# Patient Record
Sex: Male | Born: 1950 | Race: White | Hispanic: No | Marital: Married | Smoking: Never smoker
Health system: Southern US, Community
[De-identification: ages and names within clinical notes are randomized; demographics above are authoritative.]

## PROBLEM LIST (undated history)

## (undated) DIAGNOSIS — N529 Male erectile dysfunction, unspecified: Secondary | ICD-10-CM

## (undated) DIAGNOSIS — I878 Other specified disorders of veins: Secondary | ICD-10-CM

## (undated) DIAGNOSIS — G629 Polyneuropathy, unspecified: Secondary | ICD-10-CM

## (undated) DIAGNOSIS — R809 Proteinuria, unspecified: Secondary | ICD-10-CM

## (undated) DIAGNOSIS — M1990 Unspecified osteoarthritis, unspecified site: Secondary | ICD-10-CM

## (undated) DIAGNOSIS — E669 Obesity, unspecified: Secondary | ICD-10-CM

## (undated) DIAGNOSIS — E785 Hyperlipidemia, unspecified: Secondary | ICD-10-CM

## (undated) DIAGNOSIS — I10 Essential (primary) hypertension: Secondary | ICD-10-CM

## (undated) HISTORY — DX: Polyneuropathy, unspecified: G62.9

## (undated) HISTORY — DX: Hyperlipidemia, unspecified: E78.5

## (undated) HISTORY — PX: BACK SURGERY: SHX140

## (undated) HISTORY — PX: JOINT REPLACEMENT: SHX530

## (undated) HISTORY — DX: Proteinuria, unspecified: R80.9

## (undated) HISTORY — DX: Male erectile dysfunction, unspecified: N52.9

## (undated) HISTORY — PX: HERNIA REPAIR: SHX51

## (undated) HISTORY — DX: Other specified disorders of veins: I87.8

## (undated) HISTORY — DX: Obesity, unspecified: E66.9

## (undated) HISTORY — PX: REPLACEMENT TOTAL KNEE: SUR1224

---

## 2000-05-04 ENCOUNTER — Encounter: Admission: RE | Admit: 2000-05-04 | Discharge: 2000-05-04 | Payer: Self-pay | Admitting: Preventative Medicine

## 2000-05-04 ENCOUNTER — Encounter: Payer: Self-pay | Admitting: Preventative Medicine

## 2000-05-17 ENCOUNTER — Encounter: Payer: Self-pay | Admitting: Preventative Medicine

## 2000-05-17 ENCOUNTER — Encounter: Admission: RE | Admit: 2000-05-17 | Discharge: 2000-05-17 | Payer: Self-pay | Admitting: Preventative Medicine

## 2000-05-31 ENCOUNTER — Encounter: Admission: RE | Admit: 2000-05-31 | Discharge: 2000-05-31 | Payer: Self-pay | Admitting: Preventative Medicine

## 2000-05-31 ENCOUNTER — Encounter: Payer: Self-pay | Admitting: Preventative Medicine

## 2000-07-20 ENCOUNTER — Encounter: Payer: Self-pay | Admitting: Neurosurgery

## 2000-07-21 ENCOUNTER — Encounter: Payer: Self-pay | Admitting: Neurosurgery

## 2000-07-21 ENCOUNTER — Ambulatory Visit (HOSPITAL_COMMUNITY): Admission: RE | Admit: 2000-07-21 | Discharge: 2000-07-21 | Payer: Self-pay | Admitting: Neurosurgery

## 2001-11-01 ENCOUNTER — Ambulatory Visit (HOSPITAL_COMMUNITY): Admission: RE | Admit: 2001-11-01 | Discharge: 2001-11-01 | Payer: Self-pay | Admitting: Internal Medicine

## 2002-02-18 ENCOUNTER — Encounter: Payer: Self-pay | Admitting: Emergency Medicine

## 2002-02-18 ENCOUNTER — Emergency Department (HOSPITAL_COMMUNITY): Admission: EM | Admit: 2002-02-18 | Discharge: 2002-02-18 | Payer: Self-pay | Admitting: Emergency Medicine

## 2002-07-10 ENCOUNTER — Encounter: Payer: Self-pay | Admitting: Family Medicine

## 2002-07-10 ENCOUNTER — Ambulatory Visit (HOSPITAL_COMMUNITY): Admission: RE | Admit: 2002-07-10 | Discharge: 2002-07-10 | Payer: Self-pay | Admitting: Family Medicine

## 2002-07-17 ENCOUNTER — Encounter: Payer: Self-pay | Admitting: Family Medicine

## 2002-07-17 ENCOUNTER — Ambulatory Visit (HOSPITAL_COMMUNITY): Admission: RE | Admit: 2002-07-17 | Discharge: 2002-07-17 | Payer: Self-pay | Admitting: Family Medicine

## 2002-08-07 ENCOUNTER — Encounter: Payer: Self-pay | Admitting: Orthopedic Surgery

## 2002-08-12 ENCOUNTER — Inpatient Hospital Stay (HOSPITAL_COMMUNITY): Admission: RE | Admit: 2002-08-12 | Discharge: 2002-08-15 | Payer: Self-pay | Admitting: Orthopedic Surgery

## 2002-08-12 ENCOUNTER — Encounter (INDEPENDENT_AMBULATORY_CARE_PROVIDER_SITE_OTHER): Payer: Self-pay | Admitting: Specialist

## 2002-08-15 ENCOUNTER — Encounter: Payer: Self-pay | Admitting: Orthopedic Surgery

## 2006-09-08 ENCOUNTER — Ambulatory Visit (HOSPITAL_COMMUNITY): Admission: RE | Admit: 2006-09-08 | Discharge: 2006-09-08 | Payer: Self-pay | Admitting: General Surgery

## 2006-09-08 ENCOUNTER — Encounter (INDEPENDENT_AMBULATORY_CARE_PROVIDER_SITE_OTHER): Payer: Self-pay | Admitting: General Surgery

## 2009-06-22 ENCOUNTER — Ambulatory Visit: Payer: Self-pay | Admitting: Surgery
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
## 2010-06-15 NOTE — Op Note (Signed)
NAMEJORDI, LACKO                ACCOUNT NO.:  [id number]   MEDICAL RECORD NO.:  [id number]          PATIENT TYPE:  AMB   LOCATION:  DAY                          FACILITY:  [facility]   PHYSICIAN:  Timothy E. Earlene Plater, M.D. DATE OF BIRTH:  1951/01/19   DATE OF PROCEDURE:  09/08/2006  DATE OF DISCHARGE:                               OPERATIVE REPORT   PREOPERATIVE DIAGNOSIS:  Primary ventral hernia.   POSTOPERATIVE DIAGNOSIS:  Primary ventral hernia.   PROCEDURE:  Repair of hernia with mesh.   SURGEON:  Kendrick Ranch, MD   ANESTHESIA:  General.   Brandon Villarreal is 65, has an enlarging, oftentimes painful primary ventral  hernia.  He was seen earlier and strongly advised to control diabetes  and lose weight, which he has done and now he is prepared for this  surgery.  He was seen, identified and permit signed.   He was evaluated by Anesthesia.   He was taken to the operating room, placed supine and general  endotracheal anesthesia administered.  The previously located and marked  hernia defect was in the midline halfway between the umbilicus and the  sternum, palpated to be 4-6 cm; it was not reducible.  The area was  clipped, prepped and draped in the usual fashion.  Marcaine 0.5% with  epinephrine was used prior to the incision.  A horizontal incision was  made over the defect and hernia sac immediately identified; it was  separated from the surrounding subcutaneous tissue and the surrounding  fascia also dissected free for easy visualization.  The hernial defect  was actually 4 cm and the sac was opened, the excess sac excised and  multiple adhesions to the bowel and omentum were taken down sharply and  all reduced into the abdomen.  Likewise, under the edges of the fascia,  the adhesions were taken down.  A 6-cm Bard Ventralex hernia patch was  used; it was carefully placed; it was sutured in place with 4 stay  sutures, then brought flush to the surface and all sutures tied.  Four  additional sutures were placed through the anterior fascia; this  completed the repair.  The deep subcu was closed with 3-0 Vicryl over  mesh and the skin and subcu closed with wide skin staples.  All counts  were correct.  He tolerated it well and a dry sterile dressing applied.  He was taken to the recovery room in good condition.   Written and verbal instructions were given to him and his wife including  Percocet for pain and he will be seen and followed in the office.      Timothy E. Earlene Plater, M.D.  Electronically Signed     TED/MEDQ  D:  09/08/2006  T:  09/09/2006  Job:  [id number]   cc:   Donna Bernard, M.D.  Fax: [phone]

## 2010-06-15 NOTE — Procedures (Signed)
DUPLEX DEEP VENOUS EXAM - LOWER EXTREMITY   INDICATION:  Venous stasis ulcer, right leg.   HISTORY:  Edema:  Yes.  Trauma/Surgery:  No.  Pain:  Yes.  PE:  No.  Previous DVT:  No.  Anticoagulants:  No.  Other:   DUPLEX EXAM:                CFV   SFV   PopV  PTV    GSV                R  L  R  L  R  L  R   L  R  L  Thrombosis    0  0  0     0     0      0  Spontaneous   +  +  +     +     +      +  Phasic        +  +  +     +     +      +  Augmentation  +  +  +     +     +      +  Compressible  +  +  +     +     +      +  Competent     D  +  D     D     +      +   Legend:  + - yes  o - no  p - partial  D - decreased   IMPRESSION:  1. Right leg is negative for deep venous thrombosis.  2. Phlebitis is noted in the proximal right calf.  3. The greater saphenous vein and smaller saphenous vein do not      display reflux; however, there is an anterolateral tributary that      does reflux with extensive varicose veins.  4. The common femoral vein, saphenofemoral junction, superficial      femoral veins, and popliteal veins do display reflux.  5. The calf veins are difficult to visualize due to increased edema      and increased BSA.    _____________________________  V. Charlena Cross, MD   NT/MEDQ  D:  06/22/2009  T:  06/22/2009  Job:  [id number]

## 2010-06-18 NOTE — Op Note (Signed)
NAMEDEWARD, SEBEK                            ACCOUNT NO.:  [id number]   MEDICAL RECORD NO.:  [id number]                   PATIENT TYPE:  AMB   LOCATION:  DAY                                  FACILITY:  [facility]   PHYSICIAN:  Gustaf Mccarter. Rourk, M.D.               DATE OF BIRTH:  [birth year]   DATE OF PROCEDURE:  11/01/2001  DATE OF DISCHARGE:                                 OPERATIVE REPORT   PROCEDURE:  Screening colonoscopy.   ENDOSCOPIST:  Gerrit Friends. Rourk, M.D.   INDICATIONS FOR PROCEDURE:  The patient is a 60 year old gentleman referred  for colorectal cancer screening.  He is devoid of any lower GI tract  symptoms.  No family history of colorectal neoplasia.  He has never had his  lower GI tract imaged previously.  Colonoscopy is now being done as a  screening maneuver.  This approach has been discussed with the patient at  length at the bedside.  The potential risks, benefits, and alternatives have  been reviewed; questions answered  He is agreeable.  Please see my  handwritten H&P.  I feel that he is low risk for conscious sedation in the  way of Versed and Demerol.   DESCRIPTION OF PROCEDURE:  O2 saturation, blood pressure, pulse and  respirations were monitored throughout the entire procedure.   CONSCIOUS SEDATION:  Versed 3 mg IV, Demerol 75 mg IV.   INSTRUMENT:  Olympus video chip adult colonoscope.   FINDINGS:  Digital rectal exam revealed no abnormalities.   ENDOSCOPIC FINDINGS:  The prep was good.   RECTUM:  Examination of the rectal mucosa including the retroflex view of  the anal verge revealed no abnormalities.   COLON:  The colonic mucosa was surveyed from the rectosigmoid junction  through the left transverse and right colon to the area of the appendiceal  orifice, ileocecal valve, and cecum.  These structures were well seen and  photographed for the record.  No colonic mucosal abnormalities were noted.  Upon advancing the scope to the cecum and from the  level of the cecum and  ileocecal valve the scope was slowly withdrawn.  All previously mentioned  mucosal surfaces were again seen; and, again, no abnormalities were  observed.  The patient tolerated the procedure well and was reacted in  endoscopy.   IMPRESSION:  1. Normal rectum.  2.     Normal colon.  3. Follow up with Dr. Gerda Diss.   RECOMMENDATIONS:  Repeat colonoscopy in 10 years.                                               Gerrit Friends. Rourk, M.D.    RMR/MEDQ  D:  11/01/2001  T:  11/02/2001  Job:  [id number]   cc:  Dr. Gerda Diss

## 2010-06-18 NOTE — Discharge Summary (Signed)
Brandon Villarreal, NIBERT                          ACCOUNT NO.:  [id number]   MEDICAL RECORD NO.:  [id number]                   PATIENT TYPE:  INP   LOCATION:  5035                                 FACILITY:  [facility]   PHYSICIAN:  John L. Rendall, M.D.               DATE OF BIRTH:  08/24/1950   DATE OF ADMISSION:  08/12/2002  DATE OF DISCHARGE:  08/15/2002                                 DISCHARGE SUMMARY   ADMISSION DIAGNOSES:  1. Tricompartmental osteoarthritis, left knee.  2. Type 2 diabetes mellitus.  3. Hypertension.  4. Obesity.  5. Hypercholesterolemia.  6. Mild depression.  7. Peripheral vascular disease/varicose veins.   DISCHARGE DIAGNOSES:  1. Tricompartmental osteoarthritis, left knee, status post left total knee     arthroplasty.  2. Acute blood loss anemia secondary to surgery.  3. Constipation, now resolved.  4. Type 2 diabetes mellitus.  5. Hypertension.  6. Obesity.  7. Hypercholesterolemia.  8. Mild depression.  9. Peripheral vascular disease/varicose veins.   SURGICAL PROCEDURE:  On August 12, 2002, Mr. Brandon Villarreal underwent a left total knee  arthroplasty by Dr. Erasmo Villarreal assisted by Brandon Villarreal, P.A.-C.  He had a  Depuy RP insert size large, 17.5 mm thickness with an MBT revision cemented  tibial tray, size 5, with a PFC modular knee system, tibial fluted rod, 18  mm x 75 mm.  He then had an LCS knee rotating through thickened cemented  patella, large size, with an LCS complete primary femoral component and  cemented size large, left.   COMPLICATIONS:  None.   CONSULTS:  1. Case management, physical therapy and occupational therapy consult August 13, 2002.  2. Rehab medicine consult August 13, 2002.   HISTORY OF PRESENT ILLNESS:  This 60 year old white male patient presents to  Dr. Priscille Villarreal with a 5-10 year history of gradual onset progressive left knee  pain.  The pain is constant with varying in intensity.  Sharp knife-like  sensation over the lateral  aspect of the joint line without radiation.  It  increases with prolonged weightbearing and decreases with rest.  He has  failed conservative treatment and because of that he is presenting for a  left knee replacement.   HOSPITAL COURSE:  Mr. Brandon Villarreal tolerated his surgical procedure well without  any postoperative complications.  He was transferred to 5000.  On  postoperative day #1 T-max was 99.3, vital signs stable.  Hemoglobin 9.9,  hematocrit 28.  He had a large amount of bloody drainage from the knee wound  and that dressing was reinforced and monitored.   He did well over the next several days.  He was switched to p.o. pain  medicine and his knee incision was well approximated with staples.  On July  14, he was ambulating well but did get a little dizzy or lightheaded.  His  pulse was 100, blood pressure 100/57.  Hemoglobin that morning was 9 with  hematocrit of 25.8 but a repeat hemoglobin at 2 p.m. showed hemoglobin of  8.3 with hematocrit of 23.9.  He was subsequently transfused with two units  of packed red blood cells.  Repeat CBC is pending today, July 15.   At this point T-max was 100.5.  Vitals are stable.  He is ready for  discharge home.  He feels much better after his transfusion with no more  dizziness. He will be discharged home later today after repeat chest x-ray,  apical lordotic views, to evaluate possible nodule and after physical  therapy.   DISCHARGE INSTRUCTIONS:   DIET:  He is to resume his prehospitalization diabetic diet.   DISCHARGE MEDICATIONS:  1. He may resume his prehospitalization medications except no Relafen,     phentermine, aspirin or Vicodin.  2. He may restart the aspirin on July 20, after the Arixtra is completed.  Home medicines include:  1. Doxazosin 8 mg p.o. daily.  2. Lexapro 10 mg p.o. daily.  3. Lopid 600 mcg p.o. daily.  4. Lotrel 5/20 mg p.o. daily.  5. Hydrochlorothiazide 25 mg half tablet p.o. daily.  6. DiaBeta 5 mg p.o.  daily.  7. Megaman vitamins one tablet p.o. daily.  8. Coral calcium one tablet p.o. daily.  9. Chondroitin glucosamine one tablet p.o. daily.   ADDITIONAL MEDICATIONS:  1. Arixtra 2.5 mg subcu daily for seven days with last dose August 19, 2002.  2. OxyContin 10 mg two tablets p.o. q.12h. x7 days and then one tablet p.o.     q.12h. for seven days.  #42, with no refill.  1. Percocet 5/325 mg one to two p.o. q.4h. p.r.n. for pain, #45 with no     refill.   ACTIVITY:  He can be out of bed weightbearing as tolerated on the left leg  with the use of a walker.  He is arranged for home CPM 0-100 degrees a day  and home health PT per [facility].  Please see the blue total  knee discharge instruction sheet for further activity, wound care and  special instructions.  Additions to the blue discharge sheet include that he may not shower until  no drainage from the wound for two days.   FOLLOW UP:  He needs followup appointment with  Dr. Priscille Villarreal in approximately  10-12 days and he needs to call [phone] for that appointment.   LABORATORY DATA:  Chest x-ray taken on August 07, 2002 showed no active chest  disease but a right apical nodular density which was not seen on previous  chest x-ray.  Apical lordotic views are recommended and will be done on July  15 before discharge. On August 12, 2002, hemoglobin 11.5, hematocrit 32.7.  On  July 13, hemoglobin 99, hematocrit 28.  On August 14, 2002, initially  hemoglobin was 9, hematocrit 25.8, but at 2 p.m. hemoglobin was 83,  hematocrit 23.9.  Platelets were 131 and white count 8.7.  On July 7,  glucose 161.  On July 13, glucose was 169, calcium 8, and on July 17, sodium  was 131, glucose 139, calcium 8.3.  Urinalysis on July 7 showed 21-50 red  blood cells.  All other indices within normal limits.  Left knee synovium  biopsy done on August 12, 2002 showed focal villonodular architecture, synovial hyperplasia and pigmented histiocytes.      Legrand Pitts Duffy, P.A.  John L. Brandon Villarreal, M.D.    KED/MEDQ  D:  08/15/2002  T:  08/16/2002  Job:  [id number]   cc:   Donna Bernard, M.D.  [address]  [place]  [place] [address]  Fax: [phone]    cc:   Donna Bernard, M.D.  [address]  [place]  [place] [address]  Fax: [phone]

## 2010-06-18 NOTE — Op Note (Signed)
Brandon Villarreal, Brandon Villarreal                          ACCOUNT NO.:  [id number]   MEDICAL RECORD NO.:  [id number]                   PATIENT TYPE:  INP   LOCATION:  2550                                 FACILITY:  [facility]   PHYSICIAN:  John L. Rendall III, M.D.           DATE OF BIRTH:  May 04, 1950   DATE OF PROCEDURE:  08/12/2002  DATE OF DISCHARGE:                                 OPERATIVE REPORT   PREOPERATIVE DIAGNOSIS:  End stage osteoarthritis, left knee with morbid  obesity.   POSTOPERATIVE DIAGNOSIS:  End stage osteoarthritis, left knee with morbid  obesity.   OPERATION PERFORMED:  Total knee replacement, left using revision tibial  component.   SURGEON:  John L. Rendall, M.D.   ASSISTANT:  Legrand Pitts. Duffy, P.A.   ANESTHESIA:  General.   PATHOLOGY:  The patient weighs 320 pounds.  He has a worn out left knee and  an incredible number of loose bodies in the knee with significant spurring.  With all of the loose bodies, the size of the leg and spurring, it is much  more difficult than usual and due to fear of the tibial component sinking  into relatively soft bone, a revision tibial component was used in this  primary knee.   DESCRIPTION OF PROCEDURE:  Under general anesthesia, the left leg was  prepared with DuraPrep and draped as a sterile field.  A midline incision  was made carried medial parapatellar deep, the patella was everted with some  difficulty as there is a one and a half inch spur on the top of it.  Due to  difficulty was moving this heavily spurred patella, the patella osteotomy  was done under primary exposure.  The medial collateral ligament and medial  tibia were exposed and stripped from the bone half way around to the  posteromedial corner. This allowed exposure medially. A large loose body  infrapatellar then came out.  It measured 1-1/2 inch x 1-1/2 inch x 1 inch.  It was incredible.  There was significant gross red synovitis and a  synovectomy was done.  There was ridging of one half inch medially and  laterally on the femur and because this is so large, to estimate correctly a  size prosthesis to use, the spurs were taken down with a saw prior to sizing  the femur.  The femur was then sized at a large.  Preliminary cut was then  made removing the spines of the tibia.  Once  this was made, a center hole  was made and the tibia was progressively reamed up to 18 mm to take 140 mm  revision tibial stem.  Once the 18 reamer was properly seated and based  appropriately at this level, a guide was pinned to the front of the tibia  and proximal tibial resection was carried out.  Following this, using the  first femoral guide, an intercondylar drill hole was placed.  Using the  second guide, anterior and posterior flare of the femur were resected with a  15 mm flexion gap.  The intramedullary guide was then used and the distal  femoral cut was made and there was a 15 mm extension gap.  Some play was  noted in the 17 mm guide after release of spurs medially, laterally and  around the tibia.  The 17.5 mm flexion-extension block fit satisfactorily in  both directions.  Recessing guide was then used.  It should be noted that  upon first exposing the medial compartment, approximately a dozen loose  bodies about 1 cm x 1.5 cm x 0.5 cm came out of the posterior medial recess.  Before inserting the recessing guide, the lamina spreader was inserted.  Remnants of the cruciates and menisci were resected and spurs were taken  down off the back of the femoral condyles.  The recessing guide was then  used.  Following this, the proximal tibia was exposed.  The #5 tibial  component with the 140 mm revision stem was placed down the tibia.  A 17.5  bearing was inserted, a large femoral component and the large two peg  patella was tried.  With trial components in place and the retinaculum and  capsule closed with two towel clips, excellent stability and alignment was   obtained.  Permanent components were then obtained and all were cemented in  place.  Once the cement had hardened, excess was removed and the tourniquet  was let down at one hour six minutes.  The excess cement was removed.  Multiple small vessels were cauterized.  The knee was then closed in layers  with #1 Tycron, 0 and 2-0 Vicryl and skin clips.  The patient tolerated the  procedure well and returned to recovery in good condition.                                                John L. Dorothyann Gibbs, M.D.    Renato Gails  D:  08/12/2002  T:  08/12/2002  Job:  [id number]

## 2010-06-18 NOTE — Op Note (Signed)
[facility]  Patient:    Brandon Villarreal, Brandon Villarreal                         MRN: [id number] Proc. Date: 07/21/00 Adm. Date:  [id number] Disc. Date: [id number] Attending:  Gerald Dexter                           Operative Report  PREOPERATIVE DIAGNOSIS:  Herniated disk, L4-5 right.  POSTOPERATIVE DIAGNOSIS:  Herniated disk, L4-5 right.  PROCEDURE:  Right L4-5 intralaminar laminotomy for excision of herniated disk with operating microscope.  SURGEON:  Reinaldo Meeker, M.D.  ASSISTANT:  Julio Sicks, M.D.  DESCRIPTION OF PROCEDURE:  After being placed in the prone position, the patients back was prepped and draped in the usual sterile fashion.  A localizing x-ray was taken prior to incision to identify the L4-5 level.  A midline incision made above the spinous processes of L4 and L5.  Using Bovie cutting current, the incision was carried down to the spinous processes. Subperiosteal dissection was then carried out on the right side of the spinous processes and laminae, and the McCullough self-retaining retractor was placed for exposure.  A second x-ray was taken to confirm approach to the L4-5 level, and this was correct.  Using the high-speed drill, the inferior one-half of the L4 lamina and the medial third of the facet joint were removed.  A drill was then used to remove the superior one-half of the L5 lamina.  Residual bone and ligamentum flavum were removed in a piecemeal fashion.  The microscope was draped, brought in the field, and used for the remainder of the case.  Using microdissection technique, the lateral aspect of the thecal sac and L5 nerve root were identified.  Further coagulation was carried out down to the floor of the _____ to identify the L4-5 disk, which was only focally herniated beneath the nerve root.  After coagulating on the annulus, the annulus was incised with a 15 blade.  Using pituitary rongeurs and curettes, a thorough disk  space clean-out was carried out.  At the same time, great care was taken to avoid injury to the neural elements.  This was successfully done.  At this point, inspection was carried out in all directions for any evidence of residual compression, and none could be identified.  Large amounts of irrigation were carried out and any bleeding controlled with bipolar coagulation and Gelfoam.  The wound was then closed using interrupted Vicryl in the muscle, fascia, subcutaneous, and subcuticular tissues, and staples on the skin.  A sterile dressing was then applied.  Patient was extubated, taken to the recovery room in stable condition. DD:  07/21/00 TD:  07/22/00 Job: [id number]

## 2010-06-18 NOTE — H&P (Signed)
Brandon Villarreal, Brandon Villarreal                          ACCOUNT NO.:  [id number]   MEDICAL RECORD NO.:  [id number]                   PATIENT TYPE:  INP   LOCATION:  NA                                   FACILITY:  [facility]   PHYSICIAN:  John L. Rendall, M.D.               DATE OF BIRTH:  1950/09/17   DATE OF ADMISSION:  DATE OF DISCHARGE:                                HISTORY & PHYSICAL   CHIEF COMPLAINT:  Left knee pain for more than 5 to 10 years.   HISTORY OF PRESENT ILLNESS:  This 60 year old white male patient presents to  Dr. Priscille Kluver with a 5 to 10 year history of gradual onset of progressive left  knee pain. He has no known injury of prior surgery to his knee. At this  point, the pain is pretty much constant, but the intensity varies. Most of  the time, it is a sharp, knife-like sensation over the lateral aspect of the  joint line without radiation. The patient increases with any prolonged any  weight bearing and decreases with rest. He has been on Relafen in the past  which provided no real relief and Vicodin which provides a moderate amount  of relief. He complains of the knee catching, grinding, locking, giving way,  and swelling. It does occasionally awaken him during the night. He has not  had any cortisone or Hyalgan injections in the knee. He is currently walking  with a four-pronged cane and has done so for the last two weeks.   ALLERGIES:  No known drug allergies.   CURRENT MEDICATIONS:  1. Doxazosin 8 mg one tablet p.o. q.d.  2. Lexapro 10 mg one tablet p.o. q.d.  3. Lopid 600 mg one tablet p.o. q.d.  4. Lotrel 5/20 mg one tablet p.o. q.d.  5. Relafen 750 mg one tablet p.o. b.i.d., last dose several days ago.  6. Hydrochlorothiazide 25 mg half a tablet p.o. q.d.  7. DiaBeta 5 mg one tablet p.o. q.d.  8. Phentermine 37.5 mg one tablet p.o. q.d., last dose July 31, 2002.  9. Aspirin 325 mg one tablet p.o. q.d., last dose July 31, 2002.  10.      Vicodin 5/500 mg one to two  p.o. q.4h. p.r.n. for pain.  11.      Megaman vitamin one tablet p.o. q.d.  12.      Coral calcium one tablet p.o. q.d.  13.      Chondroitin and glucosamine one tablet p.o. q.d.   PAST MEDICAL HISTORY:  1. Type 2 diabetes mellitus for seven years.  2. Hypertension for seven years.  3. Hypercholesterolemia for seven years.  4. Mild depression now under control.  5. Umbilical hernia.  6. Varicose veins/venous insufficiency/peripheral vascular disease.   He denies any history of thyroid disease, hiatal hernia, peptic ulcer  disease, heart disease, asthma, or any other chronic medical conditions  other than  noted previously.   PAST SURGICAL HISTORY:  Diskectomy of L3-4 in August 2002 by Dr. Gerlene Fee.   He denies any complications from the above mentioned procedure.   SOCIAL HISTORY:  HE denies any history of cigarette smoking, alcohol use, or  drug use. He is married and has one daughter. His wife and daughter live in  a one and a half story house with four steps into the main entrance. He  currently works as a Consulting civil engineer at [employer]. His medical doctor is Dr.  Simone Curia in [place], [place].   FAMILY HISTORY:  His mother is alive at age 101 with hypertension, thyroid  disease, carotid disease, and Alzheimer's. His father died at the age of 1  with a brain tumor. He has three brothers, age 59, 47, and 59 and one sister  age 23, and they are all alive and well. His daughter is 89, and she is  alive and well.   REVIEW OF SYSTEMS:  He does have a remote history of bronchitis. He does  wear glasses. He does not have a living will nor a power of attorney. All  other systems are negative and noncontributory.   PHYSICAL EXAMINATION:  GENERAL:  Well-developed, well-nourished, overweight  white male in no acute distress. Walks with an antalgic gait and a limp.  Mood and affect are appropriate. Talks easily with examiner. Accompanied by  his wife. He does walk with a  four-pronged cane. Height 62 inches, weight  324 pounds, BMI is 41.  VITAL SIGNS:  Temperature 97.8 degrees Fahrenheit, pulse 80, respirations  16, and BP 138/82.  HEENT:  Normocephalic, atraumatic without frontal or maxillary sinus  tenderness to palpation. Conjunctivae are pink. Sclerae are anicteric.  PERRLA. EOMs intact. No visible external ear deformities. Hearing grossly  intact. Tympanic membranes pearly gray bilaterally with good light reflex.  Nose and nasal septum midline. Nasal mucosa pink and moist with some minimal  bloody looking mucus in the nose. Pharynx without erythema or exudates.  Buccal mucosa is pink and moist. Missing several teeth, but dentition  appears to be in generally good repair. Tongue and uvula midline. Tongue  without fasciculations and rises equally with phonation.  NECK:  No visible masses or lesions noted. Trachea midline. No palpable  lymphadenopathy or thyromegaly. Carotids +1 bilaterally without bruits. Full  range of motion and nontender to palpation along the cervical spine.  CARDIOVASCULAR:  Heart rate and rhythm regular. S1 and S2 present without  rubs, clicks, or murmurs noted.  RESPIRATORY:  Respirations even and unlabored. Breath sounds clear to  auscultation bilaterally without rales or wheezes noted.  ABDOMEN:  Rounded abdominal contour. Bowel sounds present x4 quadrants.  Soft, nontender to palpation without hepatosplenomegaly or CVA tenderness.  Femoral pulses are +2 bilaterally. Nontender to palpation along the entire  length of vertebral column.  BREASTS/GENITOURINARY/RECTAL:  These exams deferred at this time.  MUSCULOSKELETAL:  No obvious deformities bilateral upper extremities with  full range of motion. Moves extremities without pain. Radial pulses +2  bilaterally. He has full range of motion of his hips, ankles, and toes  bilaterally. DP pulses are +2 bilaterally. He does have brownish discoloration of his lower leg on the right  and +2-3 pitting edema of both  lower extremities. Visible varicose veins on both lower legs. Right knee has  no obvious deformity. Full extension and flexion to 120 degrees with minimal  crepitus. No pain with palpation along the joint line. Stable to varus and  valgus stress and negative anterior drawer and no effusion. Left knee has  full extension but flexed to 80 degrees only. There is a +2-3 effusion in  the knee. A large amount of crepitus with range of motion. Acutely tender to  palpation on both the medial and lateral joint line. Stable to varus and  valgus stress and negative anterior drawer.  NEUROLOGICAL:  Alert and oriented x3. Cranial nerves II-XII are grossly  intact. Strength 5/5 bilateral upper and lower extremities. Rapid  alternating movements intact. Deep tendon reflexes 1+ bilateral upper and  lower extremities. Sensation intact to light touch.   RADIOLOGIC FINDINGS:  MRA taken of his left leg on June 16 showed bone  bruise and marrow contusion and depressed fracture of the lateral tibial  plateau with advanced degenerative osteoarthritic changes in all three  compartments. There is a knee joint effusion and severe diffuse  chondromalacia. He does have abnormal menisci and multiple loose bodies. He  does have prepatellar and pretibial edema and lateral subluxation of the  patella.   IMPRESSION:  1. Tricompartmental arthritis, left knee.  2. Type 2 diabetes mellitus.  3. Hypertension.  4. Obesity.  5. Hypercholesterolemia.  6. Mild depression.  7. Peripheral vascular disease/varicose veins.   PLAN:  Ms. Maybury will be admitted to [facility] on August 12, 2002  where he will undergo a left total knee arthroplasty by Dr. Jonny Ruiz L. Rendall.  He will undergo all of the routine preoperative laboratory tests  and  studies prior to this procedure.     Legrand Pitts Duffy, P.A.                      John L. Priscille Kluver, M.D.    KED/MEDQ  D:  07/31/2002  T:  07/31/2002   Job:  [id number]

## 2010-11-15 LAB — BASIC METABOLIC PANEL
BUN: 15
Chloride: 101
GFR calc Af Amer: >60
Potassium: 4.4
Sodium: 137

## 2010-11-15 LAB — URINALYSIS, ROUTINE W REFLEX MICROSCOPIC
Bilirubin Urine: NEGATIVE
Hgb urine dipstick: NEGATIVE
Ketones, ur: NEGATIVE
Nitrite: NEGATIVE
Specific Gravity, Urine: 1.018
pH: 6

## 2010-11-15 LAB — CBC
HCT: 41.1
Hemoglobin: 14.5
MCV: 84.5
RBC: 4.86
WBC: 7.8

## 2010-11-15 LAB — DIFFERENTIAL
Eosinophils Absolute: 0.2
Eosinophils Relative: 3
Lymphs Abs: 2.2
Monocytes Absolute: 0.5
Monocytes Relative: 7

## 2011-01-21 ENCOUNTER — Ambulatory Visit: Payer: Self-pay

## 2011-02-02 ENCOUNTER — Encounter (HOSPITAL_COMMUNITY): Payer: Self-pay

## 2011-02-03 NOTE — Patient Instructions (Signed)
20 Brandon Villarreal  02/03/2011   Your procedure is scheduled on:  02/10/11  Report to [facility] at 08:30 AM.  Call this number if you have problems the morning of surgery: [phone]   Remember:   Do not eat food:After Midnight.  May have clear liquids:until Midnight .  Clear liquids include soda, tea, black coffee, apple or grape juice, broth.  Take these medicines the morning of surgery with A SIP OF WATER: Amlodipine and Benazepril.   Do not wear jewelry, make-up or nail polish.  Do not wear lotions, powders, or perfumes. You may wear deodorant.  Do not shave 48 hours prior to surgery.  Do not bring valuables to the hospital.  Contacts, dentures or bridgework may not be worn into surgery.  Leave suitcase in the car. After surgery it may be brought to your room.  For patients admitted to the hospital, checkout time is 11:00 AM the day of discharge.   Patients discharged the day of surgery will not be allowed to drive home.  Name and phone number of your driver:   Special Instructions: CHG Shower Use Special Wash: 1/2 bottle night before surgery and 1/2 bottle morning of surgery.   Please read over the following fact sheets that you were given: Pain Booklet, MRSA Information, Surgical Site Infection Prevention, Anesthesia Post-op Instructions and Care and Recovery After Surgery    Hammer Toes Hammer toes occur when the joint in one or more of your toes is permanently flexed. CAUSES  This happens when a muscle imbalance or abnormal bone length makes the small toes buckle under. This causes the toe joint to contract. This causes the tendons (cord like structure) to shorten.  SYMPTOMS   When hammer toes are flexible, you can straighten the buckled joint with your hand. Flexible hammer toes may develop into rigid hammer toes over time. Common symptoms of flexible hammer toes include:   Corns (build-ups of skin cells). Corns occur where boney bumps come in frequent contact with hard  surfaces. For example, where your shoes press and rub.   Irritation.   Inflammation.   Pain.   Toe motion is limited.   When a rigid hammer toe is fixed you can no longer straighten the buckled joint. Corns, irritation, pain, and loss of motion is generally worse for rigid hammer toes than for flexible ones.  TREATMENT  The problems noted above if painful or troublesome can be corrected with surgery. This is an elective surgery, so you can pick a convenient time for the procedure. The surgery may:  Improve appearance.   Relieve pain.   Improve function.  You may be asked not to put weight on this foot for a few weeks. There are several types of surgical treatments. Common treatments are listed below. Your surgeon will discuss what is be best for you.   With arthroplasty, a portion of the joint is surgically removed and the toe is straightened. The "gap" fills in with fibroustissue. This helps with pain, deformity and function.   With fusion, cartilage between the two bones is taken out and the bones heal as one longer bone. This helps keep the toe stable and reduces pain but leaves the toe stiff, yet straight.   With implant, a portion of the bone is removed and replaced with an implant to restore motion.   Flexor tendon transfers may be used to release the deforming force which buckles the toe. This is done by the repositioning of the tendons that  curl the toes down (flexor tendons).  Several of these options require fixing the toe with a pin that is visible at the tip of the toe. The pin keeps the toe straight during healing. It is generally removed in the office at 4-8 weeks after the corrective procedure. Generally, removing the pin is not painful.  LET YOUR CAREGIVER KNOW ABOUT:  Allergies.   Medications taken including herbs, eye drops, over the counter medications, and creams.   Use of steroids (by mouth or creams).   Previous problems with anesthetics or novocaine.    Possibility of pregnancy, if this applies.   History of blood clots (thrombophlebitis).   History of bleeding or blood problems.   Previous surgery.   Other health problems.   Family history of anesthetic problems.  BEFORE THE PROCEDURE You should be present 60 minutes prior to your procedure unless otherwise directed by your caregiver.  RISKS AND COMPLICATIONS  If surgery is recommended, your caregiver will explain your foot problem and how surgery can improve it. Your caregiver can answer questions you may have about potential risks and complications involved.  Let your caregiver know about health changes prior to surgery. It is best to do elective surgeries when you are healthy. Be sure to ask your caregiver how long you will be off your feet and if you need to be off work. Plan accordingly. Your foot and ankle may be immobilized by a cast (from your toes to below your knee). You may be asked not to bear weight on this foot for a few weeks. AFTER THE PROCEDURE You can bear weight as instructed. You may need a bandage, splint, and removable cast boot or surgical shoe for several weeks after surgery. You may resume normal diet and activities as directed. Only take over-the-counter or prescription medicines for pain, discomfort, or fever as directed by your caregiver. SEEK MEDICAL CARE IF:   You have increased bleeding (more than a small spot) from the wound.   You notice redness, swelling, or increasing pain in the wound.   You notice pus coming from the wound or the pin that is used to stabilize the toe.   You notice a bad smell coming from the wound or dressing.  SEEK IMMEDIATE MEDICAL CARE IF:   You have a fever.   You develop a rash.   You have difficulty breathing.   You have any allergic problems.  Document Released: 01/15/2000 Document Revised: 09/29/2010 Document Reviewed: 02/15/2008 [facility] Patient Information 2012 [place], [place].   PATIENT  INSTRUCTIONS POST-ANESTHESIA  IMMEDIATELY FOLLOWING SURGERY:  Do not drive or operate machinery for the first twenty four hours after surgery.  Do not make any important decisions for twenty four hours after surgery or while taking narcotic pain medications or sedatives.  If you develop intractable nausea and vomiting or a severe headache please notify your doctor immediately.  FOLLOW-UP:  Please make an appointment with your surgeon as instructed. You do not need to follow up with anesthesia unless specifically instructed to do so.  WOUND CARE INSTRUCTIONS (if applicable):  Keep a dry clean dressing on the anesthesia/puncture wound site if there is drainage.  Once the wound has quit draining you may leave it open to air.  Generally you should leave the bandage intact for twenty four hours unless there is drainage.  If the epidural site drains for more than 36-48 hours please call the anesthesia department.  QUESTIONS?:  Please feel free to call your physician or the hospital  operator if you have any questions, and they will be happy to assist you.     [facility] Anesthesia Department [address] [place] [phone]

## 2011-02-04 ENCOUNTER — Encounter (HOSPITAL_COMMUNITY): Payer: Self-pay

## 2011-02-04 ENCOUNTER — Encounter (HOSPITAL_COMMUNITY)
Admission: RE | Admit: 2011-02-04 | Discharge: 2011-02-04 | Disposition: A | Discharge status: Home / Self Care | Payer: 59 | Source: Ambulatory Visit | Attending: Podiatry | Admitting: Podiatry

## 2011-02-04 ENCOUNTER — Other Ambulatory Visit: Payer: Self-pay

## 2011-02-04 HISTORY — DX: Essential (primary) hypertension: I10

## 2011-02-04 LAB — BASIC METABOLIC PANEL
BUN: 16 mg/dL (ref 6–23)
CO2: 26 mEq/L (ref 19–32)
Chloride: 102 mEq/L (ref 96–112)
Creatinine, Ser: 0.59 mg/dL (ref 0.50–1.35)

## 2011-02-04 LAB — SURGICAL PCR SCREEN: MRSA, PCR: NEGATIVE

## 2011-02-10 ENCOUNTER — Ambulatory Visit (HOSPITAL_COMMUNITY)
Admission: RE | Admit: 2011-02-10 | Discharge: 2011-02-10 | Disposition: A | Discharge status: Home / Self Care | Payer: 59 | Source: Ambulatory Visit | Attending: Podiatry | Admitting: Podiatry

## 2011-02-10 ENCOUNTER — Encounter (HOSPITAL_COMMUNITY): Payer: Self-pay | Admitting: *Deleted

## 2011-02-10 ENCOUNTER — Encounter (HOSPITAL_COMMUNITY): Payer: Self-pay | Admitting: Anesthesiology

## 2011-02-10 ENCOUNTER — Ambulatory Visit (HOSPITAL_COMMUNITY): Payer: 59 | Admitting: Anesthesiology

## 2011-02-10 ENCOUNTER — Encounter (HOSPITAL_COMMUNITY)
Admission: RE | Disposition: A | Discharge status: Home / Self Care | Payer: Self-pay | Source: Ambulatory Visit | Attending: Podiatry

## 2011-02-10 DIAGNOSIS — Z794 Long term (current) use of insulin: Secondary | ICD-10-CM | POA: Insufficient documentation

## 2011-02-10 DIAGNOSIS — Z79899 Other long term (current) drug therapy: Secondary | ICD-10-CM | POA: Insufficient documentation

## 2011-02-10 DIAGNOSIS — Z01812 Encounter for preprocedural laboratory examination: Secondary | ICD-10-CM | POA: Insufficient documentation

## 2011-02-10 DIAGNOSIS — M2040 Other hammer toe(s) (acquired), unspecified foot: Secondary | ICD-10-CM

## 2011-02-10 DIAGNOSIS — Z6841 Body Mass Index (BMI) 40.0 and over, adult: Secondary | ICD-10-CM | POA: Insufficient documentation

## 2011-02-10 DIAGNOSIS — Z01810 Encounter for preprocedural cardiovascular examination: Secondary | ICD-10-CM | POA: Insufficient documentation

## 2011-02-10 DIAGNOSIS — E119 Type 2 diabetes mellitus without complications: Secondary | ICD-10-CM | POA: Insufficient documentation

## 2011-02-10 DIAGNOSIS — I10 Essential (primary) hypertension: Secondary | ICD-10-CM | POA: Insufficient documentation

## 2011-02-10 DIAGNOSIS — L97509 Non-pressure chronic ulcer of other part of unspecified foot with unspecified severity: Secondary | ICD-10-CM | POA: Insufficient documentation

## 2011-02-10 HISTORY — PX: HAMMER TOE SURGERY: SHX385

## 2011-02-10 LAB — GLUCOSE, CAPILLARY: Glucose-Capillary: 219 mg/dL — ABNORMAL HIGH (ref 70–99)

## 2011-02-10 SURGERY — CORRECTION, HAMMER TOE
Anesthesia: Monitor Anesthesia Care | Site: Foot | Laterality: Right | Wound class: Clean

## 2011-02-10 MED ORDER — ONDANSETRON HCL 4 MG/2ML IJ SOLN: 4.0000 mg | Freq: Once | INTRAMUSCULAR | Status: DC | PRN

## 2011-02-10 MED ORDER — FENTANYL CITRATE 0.05 MG/ML IJ SOLN
INTRAMUSCULAR | Status: AC
  Filled 2011-02-10: qty 2

## 2011-02-10 MED ORDER — FENTANYL CITRATE 0.05 MG/ML IJ SOLN
INTRAMUSCULAR | Status: DC | PRN
  Administered 2011-02-10 (×2): 25 ug via INTRAVENOUS
  Administered 2011-02-10: 50 ug via INTRAVENOUS

## 2011-02-10 MED ORDER — LACTATED RINGERS IV SOLN
INTRAVENOUS | Status: DC | PRN
  Administered 2011-02-10: 10:00:00 via INTRAVENOUS

## 2011-02-10 MED ORDER — CEFAZOLIN SODIUM 1-5 GM-% IV SOLN
INTRAVENOUS | Status: DC | PRN
  Administered 2011-02-10: 2 g via INTRAVENOUS

## 2011-02-10 MED ORDER — BUPIVACAINE HCL (PF) 0.5 % IJ SOLN
INTRAMUSCULAR | Status: AC
  Filled 2011-02-10: qty 30

## 2011-02-10 MED ORDER — MIDAZOLAM HCL 2 MG/2ML IJ SOLN
INTRAMUSCULAR | Status: AC
  Filled 2011-02-10: qty 2

## 2011-02-10 MED ORDER — CEFAZOLIN SODIUM-DEXTROSE 2-3 GM-% IV SOLR: 2.0000 g | INTRAVENOUS | Status: DC

## 2011-02-10 MED ORDER — FENTANYL CITRATE 0.05 MG/ML IJ SOLN: 25.0000 ug | INTRAMUSCULAR | Status: DC | PRN

## 2011-02-10 MED ORDER — LIDOCAINE HCL (PF) 1 % IJ SOLN
INTRAMUSCULAR | Status: AC
  Filled 2011-02-10: qty 5

## 2011-02-10 MED ORDER — LACTATED RINGERS IV SOLN: INTRAVENOUS | Status: DC

## 2011-02-10 MED ORDER — MIDAZOLAM HCL 2 MG/2ML IJ SOLN
1.0000 mg | INTRAMUSCULAR | Status: DC | PRN
  Administered 2011-02-10: 2 mg via INTRAVENOUS

## 2011-02-10 MED ORDER — MIDAZOLAM HCL 5 MG/5ML IJ SOLN
INTRAMUSCULAR | Status: DC | PRN
  Administered 2011-02-10: 2 mg via INTRAVENOUS

## 2011-02-10 MED ORDER — PROPOFOL 10 MG/ML IV EMUL
INTRAVENOUS | Status: DC | PRN
  Administered 2011-02-10: 100 ug/kg/min via INTRAVENOUS

## 2011-02-10 MED ORDER — GLYCOPYRROLATE 0.2 MG/ML IJ SOLN
INTRAMUSCULAR | Status: AC
  Filled 2011-02-10: qty 1

## 2011-02-10 MED ORDER — GLYCOPYRROLATE 0.2 MG/ML IJ SOLN
0.2000 mg | Freq: Once | INTRAMUSCULAR | Status: AC
  Administered 2011-02-10: 0.2 mg via INTRAVENOUS

## 2011-02-10 MED ORDER — HYDROCODONE-ACETAMINOPHEN 7.5-500 MG PO TABS: 1.0000 | ORAL_TABLET | Freq: Four times a day (QID) | ORAL | Status: AC | PRN

## 2011-02-10 MED ORDER — PROPOFOL 10 MG/ML IV EMUL
INTRAVENOUS | Status: AC
  Filled 2011-02-10: qty 20

## 2011-02-10 MED ORDER — BUPIVACAINE HCL 0.5 % IJ SOLN
INTRAMUSCULAR | Status: DC | PRN
  Administered 2011-02-10: 10 mL

## 2011-02-10 MED ORDER — CEFAZOLIN SODIUM-DEXTROSE 2-3 GM-% IV SOLR
INTRAVENOUS | Status: AC
  Filled 2011-02-10: qty 50

## 2011-02-10 MED ORDER — LIDOCAINE HCL (PF) 2 % IJ SOLN
INTRAMUSCULAR | Status: AC
  Filled 2011-02-10: qty 10

## 2011-02-10 SURGICAL SUPPLY — 47 items
BAG HAMPER (MISCELLANEOUS) ×2 IMPLANT
BANDAGE ELASTIC 6 VELCRO ST LF (GAUZE/BANDAGES/DRESSINGS) ×2 IMPLANT
BANDAGE ESMARK 4X12 BL STRL LF (DISPOSABLE) ×1 IMPLANT
BANDAGE GAUZE ELAST BULKY 4 IN (GAUZE/BANDAGES/DRESSINGS) ×2 IMPLANT
BLADE OSC/SAG 18.5X9 THN (BLADE) ×2 IMPLANT
BLADE OSC/SAGITTAL MD 5.5X18 (BLADE) ×2 IMPLANT
BLADE SURG 15 STRL LF DISP TIS (BLADE) ×1 IMPLANT
BLADE SURG 15 STRL SS (BLADE) ×1
BNDG ESMARK 4X12 BLUE STRL LF (DISPOSABLE) ×2
CHLORAPREP W/TINT 26ML (MISCELLANEOUS) ×2 IMPLANT
CLOTH BEACON ORANGE TIMEOUT ST (SAFETY) ×2 IMPLANT
COVER LIGHT HANDLE STERIS (MISCELLANEOUS) ×4 IMPLANT
CUFF TOURNIQUET SINGLE 18IN (TOURNIQUET CUFF) ×2 IMPLANT
DECANTER SPIKE VIAL GLASS SM (MISCELLANEOUS) ×2 IMPLANT
DRAPE OEC MINIVIEW 54X84 (DRAPES) ×2 IMPLANT
DRSG ADAPTIC 3X8 NADH LF (GAUZE/BANDAGES/DRESSINGS) ×2 IMPLANT
DRSG XEROFORM 1X8 (GAUZE/BANDAGES/DRESSINGS) ×2 IMPLANT
DURA STEPPER LG (CAST SUPPLIES) IMPLANT
DURA STEPPER MED (CAST SUPPLIES) IMPLANT
DURA STEPPER SML (CAST SUPPLIES) IMPLANT
DURA STEPPER XL (SOFTGOODS) IMPLANT
ELECT REM PT RETURN 9FT ADLT (ELECTROSURGICAL) ×2
ELECTRODE REM PT RTRN 9FT ADLT (ELECTROSURGICAL) ×1 IMPLANT
GAUZE KERLIX 2  STERILE LF (GAUZE/BANDAGES/DRESSINGS) ×2 IMPLANT
GLOVE BIO SURGEON STRL SZ7.5 (GLOVE) ×2 IMPLANT
GLOVE ECLIPSE 6.5 STRL STRAW (GLOVE) ×2 IMPLANT
GLOVE INDICATOR 7.0 STRL GRN (GLOVE) ×2 IMPLANT
GOWN STRL REIN XL XLG (GOWN DISPOSABLE) ×4 IMPLANT
K-WIRE 6 (WIRE)
KIT ROOM TURNOVER APOR (KITS) ×2 IMPLANT
KWIRE 6 (WIRE) IMPLANT
MANIFOLD NEPTUNE II (INSTRUMENTS) ×2 IMPLANT
NEEDLE HYPO 27GX1-1/4 (NEEDLE) ×6 IMPLANT
NS IRRIG 1000ML POUR BTL (IV SOLUTION) ×2 IMPLANT
PACK BASIC LIMB (CUSTOM PROCEDURE TRAY) ×2 IMPLANT
PAD ARMBOARD 7.5X6 YLW CONV (MISCELLANEOUS) ×2 IMPLANT
PIN CAPS ORTHO GREEN .062 (PIN) IMPLANT
RASP SM TEAR CROSS CUT (RASP) ×2 IMPLANT
SET BASIN LINEN APH (SET/KITS/TRAYS/PACK) ×2 IMPLANT
SPONGE GAUZE 4X4 12PLY (GAUZE/BANDAGES/DRESSINGS) ×2 IMPLANT
STRIP CLOSURE SKIN 1/8X3 (GAUZE/BANDAGES/DRESSINGS) IMPLANT
SUT ETHILON 3 0 FSL (SUTURE) ×2 IMPLANT
SUT MON AB 5-0 PS2 18 (SUTURE) ×2 IMPLANT
SUT VIC AB 4-0 PS2 27 (SUTURE) ×2 IMPLANT
SUT VICRYL AB 3-0 FS1 BRD 27IN (SUTURE) ×2 IMPLANT
SYR CONTROL 10ML LL (SYRINGE) ×4 IMPLANT
TOWEL OR 17X26 4PK STRL BLUE (TOWEL DISPOSABLE) IMPLANT

## 2011-02-10 NOTE — Brief Op Note (Signed)
02/10/2011  12:03 PM  PATIENT:  Brandon Villarreal  61 y.o. 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

## 2011-02-10 NOTE — Anesthesia Postprocedure Evaluation (Signed)
  Anesthesia Post-op Note  Patient: Brandon Villarreal  Procedure(s) Performed:  HAMMER TOE CORRECTION - Percutaneous Flexor Tenotomy third Toe Right Foot  Patient Location: PACU  Anesthesia Type: MAC  Level of Consciousness: awake, alert , oriented and patient cooperative  Airway and Oxygen Therapy: Patient Spontanous Breathing and Patient connected to face mask oxygen  Post-op Pain: none  Post-op Assessment: Post-op Vital signs reviewed, Patient's Cardiovascular Status Stable, Respiratory Function Stable, Patent Airway, No signs of Nausea or vomiting and Pain level controlled  Post-op Vital Signs: Reviewed and stable  Complications: No apparent anesthesia complications

## 2011-02-10 NOTE — H&P (Addendum)
HISTORY AND PHYSICAL INTERVAL NOTE:  02/10/2011  10:07 AM  Brandon Villarreal  has presented today for surgery, with the diagnosis of hammer toe 3rd toe right foot, non-healing wound.  The various methods of treatment have been discussed with the patient.  No guarantees were given.  After consideration of risks, benefits and other options for treatment, the patient has consented to surgery.  I have reviewed the patients' chart and labs.    Patient Vitals for the past 24 hrs:  BP Temp Temp src Pulse Resp SpO2  02/10/11 0921 139/84 mmHg 98.7 F (37.1 C) Oral 83  18  94 %   A history and physical examination was performed in my office on 01/19/2011.  I have reexamined him today.  There have been no changes to this history and physical examination.  Brandon Villarreal, DPM

## 2011-02-10 NOTE — H&P (Signed)
Chief Complaint / History of Present Illness: This 61 year old male returns today for a consultation to discusses the proposed surgery.  He is scheduled for flexor tenotomy of the 3rd digits of the right foot on 02/10/2011 at [facility] under local anesthesia.  Past Medical History: Endocrine Hx: (+) diabetes Type 2.  Cardiovascular Hx: (+) high cholesterol, hypertension.   Medication History: Active: Aspirin 81 mg (active), fenofibrate 160 mg tablet (active), doxazasin mesylate tablets 4 mg tablet (active), furosemide 40 mg tablet (active), klor con m20 tablets (active), amlodipine 10 mg tablet (active), benzepril hcl tablets 40 mg (active), Silvadene 1% cream (Apply cream to surgical site / wound at dressing changes.) (active); usage started on 11/24/2010 medication was prescribed by Theola Sequin DPM, lantus insulin (active), magnesium 500 mg (active), glyburide / metformin 5/500 mg (active), pravastatin 20 mg tablet (active).   Allergies: Patient/Guardian admits allergies to no known drug allergies.   Past Surgical History: Patient/Guardian admits past surgical history of back surgery, knee replacement (left).   Social History: Patient/Guardian denies illegal drug use, Patient/Guardian denies alcohol use, Patient/Guardian denies tobacco use.   Family History: Patient denies a family history of any health problems.   Review of Systems: No abnormal findings with the exception of the chief complaint.    Physical Exam: The patient is a pleasant, 61 year old male in no apparent distress.  He is oriented to person, place and time.  He is ambulatory.  Skin is warm, dry and supple.  Right great toenail and right 2nd toenail is absent.  Pedal hair growth and distribution is diminished.  A hyperkeratotic lesion is present along the distal aspect of right 3rd toe.  Upon debridement of fibrotic tissue, the ulceration measured 0.2 in lenght by 0.2 cm in width by 0.1 cm in depth.  The remaining wound bed  is granular with no acute signs of infection.  Dorsalis pedis and posterior tibial pulses are palpable but diminished.  Capillary refill time is immediate.  No pedal edema is present.  There is dorsal contraction of the 2nd digit and the 3rd digit of the right foot.  Protective sensation is decreased as evaluated with a 5.07 monofilament.    Test Results:  None to report at this time.    Impression:  Diabetes mellitus with peripheral neuropathy.  Hammertoe deformity.  Ulceration.  Plan:   The podiatric pathology and treatment options were again reviewed with the him.  I discussed with the him the surgical procedure itself, the indications, the risks, possible complications, post-operative course and alternative treatments. I gave no guarantees regarding the outcome. He would like to proceed with the proposed surgery.  An informed consent was obtained.  He is to return for his post-operative appointment or sooner if problems arise.

## 2011-02-10 NOTE — Anesthesia Preprocedure Evaluation (Addendum)
Anesthesia Evaluation  Patient identified by MRN, date of birth, ID band Patient awake    Reviewed: Allergy & Precautions, H&P , NPO status , Patient's Chart, lab work & pertinent test results  History of Anesthesia Complications Negative for: history of anesthetic complications  Airway Mallampati: I      Dental  (+) Teeth Intact   Pulmonary  Sinus coingestion clear to auscultation        Cardiovascular hypertension, Pt. on medications Regular Normal    Neuro/Psych    GI/Hepatic   Endo/Other  Diabetes mellitus-, Type 2, Insulin DependentMorbid obesity  Renal/GU      Musculoskeletal   Abdominal (+) obese,   Peds  Hematology   Anesthesia Other Findings   Reproductive/Obstetrics                           Anesthesia Physical Anesthesia Plan  ASA: III  Anesthesia Plan: MAC   Post-op Pain Management:    Induction: Intravenous  Airway Management Planned: Nasal Cannula  Additional Equipment:   Intra-op Plan:   Post-operative Plan:   Informed Consent: I have reviewed the patients History and Physical, chart, labs and discussed the procedure including the risks, benefits and alternatives for the proposed anesthesia with the patient or authorized representative who has indicated his/her understanding and acceptance.     Plan Discussed with:   Anesthesia Plan Comments:         Anesthesia Quick Evaluation

## 2011-02-10 NOTE — Preoperative (Signed)
Beta Blockers   Reason not to administer Beta Blockers:Not Applicable 

## 2011-02-10 NOTE — Transfer of Care (Signed)
Immediate Anesthesia Transfer of Care Note  Patient: Brandon Villarreal  Procedure(s) Performed:  HAMMER TOE CORRECTION - Percutaneous Flexor Tenotomy third Toe Right Foot  Patient Location: PACU  Anesthesia Type: MAC  Level of Consciousness: awake, alert , oriented and patient cooperative  Airway & Oxygen Therapy: Patient Spontanous Breathing and Patient connected to face mask oxygen  Post-op Assessment: Report given to PACU RN and Post -op Vital signs reviewed and stable  Post vital signs: Reviewed and stable Filed Vitals:   02/10/11 1010  BP: 130/81  Pulse:   Temp:   Resp: 18    Complications: No apparent anesthesia complications

## 2011-02-11 NOTE — Op Note (Signed)
Brandon Villarreal, Brandon Villarreal                ACCOUNT NO.:  [id number]  MEDICAL RECORD NO.:  [id number]  LOCATION:  [facility]                          FACILITY:  [facility]  PHYSICIAN:  B. Theola Sequin, MD   DATE OF BIRTH:  December 11, 1950  DATE OF PROCEDURE:  02/10/2011 DATE OF DISCHARGE:  02/10/2011                              OPERATIVE REPORT   SURGEON:  B. Theola Sequin, MD  ASSISTANT:  None.  PREOPERATIVE DIAGNOSIS:  Hammertoe deformity, third digit, right foot, ICD 9735.4, and #2 nonhealing ulceration, right third toe, ICD 1610.96.  POSTOPERATIVE DIAGNOSIS:  Hammertoe deformity, third digit, right foot, ICD 9735.4, and #2 nonhealing ulceration, right third toe, ICD 0454.09.  PROCEDURE:  Percutaneous flexor tenotomy of the third toe, right foot, CPT code, March 10, 2008.  ANESTHESIA:  MAC with local hemostasis.  Pneumatic ankle tourniquet at 250 mmHg.  ESTIMATED BLOOD LOSS:  Minimal (less than 5 mL).  INJECTABLE:  0.5% Marcaine plain.  PATHOLOGY:  None.  COMPLICATIONS:  None.  PROCEDURE IN DETAIL:  The patient was brought to the operating room, altar made on the stretcher in the supine position.  A pneumatic ankle tourniquet was placed about the patient's right ankle.  The foot was anesthetized using 0.5% Marcaine plain.  The foot was scrubbed, prepped, and draped in usual sterile manner.  The limb was then elevated, exsanguinated, and pneumatic ankle tourniquet inflated to 250 mmHg. Attention was then directed to the distal aspect of the third toe of the right foot where a hemorrhagic hyperkeratotic lesion was noted.  This hyperkeratotic tissue was debrided.  Upon debridement, a partial- thickness ulceration was noted along the distal aspect of the right third toe.  No acute sign of infection was present.  Attention was then directed to the plantar aspect of the right third toe, where a percutaneous flexor tenotomy was performed.  The contracture on the toe was noted to be reduced.   Following this procedure, the wound was irrigated with copious amounts of sterile saline and the skin reapproximated using 3-0 nylon in a simple suture technique.  A sterile compressive dressing was then applied to the right foot.  The pneumatic ankle tourniquet deflated and a prompt hyperemic response was noted to all digits of the operative foot.          ______________________________ B. Theola Sequin, MD     BIM/MEDQ  D:  02/10/2011  T:  02/11/2011  Job:  [id number]

## 2011-02-14 ENCOUNTER — Encounter (HOSPITAL_COMMUNITY): Payer: Self-pay | Admitting: Podiatry

## 2011-03-23 ENCOUNTER — Other Ambulatory Visit (HOSPITAL_COMMUNITY): Payer: Self-pay | Admitting: Podiatry

## 2011-03-23 DIAGNOSIS — R609 Edema, unspecified: Secondary | ICD-10-CM

## 2011-03-24 ENCOUNTER — Other Ambulatory Visit (HOSPITAL_COMMUNITY): Payer: Self-pay | Admitting: Podiatry

## 2011-03-24 ENCOUNTER — Ambulatory Visit (HOSPITAL_COMMUNITY)
Admission: RE | Admit: 2011-03-24 | Discharge: 2011-03-24 | Disposition: A | Discharge status: Home / Self Care | Payer: 59 | Source: Ambulatory Visit | Attending: Podiatry | Admitting: Podiatry

## 2011-03-24 DIAGNOSIS — M79609 Pain in unspecified limb: Secondary | ICD-10-CM | POA: Insufficient documentation

## 2011-03-24 DIAGNOSIS — R609 Edema, unspecified: Secondary | ICD-10-CM

## 2011-03-24 DIAGNOSIS — M7989 Other specified soft tissue disorders: Secondary | ICD-10-CM | POA: Insufficient documentation

## 2012-05-01 ENCOUNTER — Other Ambulatory Visit: Payer: Self-pay | Admitting: Family Medicine

## 2012-05-01 ENCOUNTER — Other Ambulatory Visit: Payer: Self-pay | Admitting: *Deleted

## 2012-05-06 ENCOUNTER — Other Ambulatory Visit: Payer: Self-pay | Admitting: Family Medicine

## 2012-05-12 ENCOUNTER — Other Ambulatory Visit: Payer: Self-pay | Admitting: Family Medicine

## 2012-05-12 LAB — HEPATIC FUNCTION PANEL
ALT: 21 U/L (ref 0–53)
AST: 17 U/L (ref 0–37)
Bilirubin, Direct: 0.1 mg/dL (ref 0.0–0.3)
Indirect Bilirubin: 0.5 mg/dL (ref 0.0–0.9)
Total Bilirubin: 0.6 mg/dL (ref 0.3–1.2)

## 2012-05-12 LAB — LIPID PANEL
Cholesterol: 191 mg/dL (ref 0–200)
Total CHOL/HDL Ratio: 5.3 Ratio

## 2012-05-21 ENCOUNTER — Encounter: Payer: Self-pay | Admitting: Family Medicine

## 2012-06-02 ENCOUNTER — Other Ambulatory Visit: Payer: Self-pay | Admitting: Family Medicine

## 2012-06-26 ENCOUNTER — Ambulatory Visit (INDEPENDENT_AMBULATORY_CARE_PROVIDER_SITE_OTHER): Payer: 59 | Admitting: Family Medicine

## 2012-06-26 ENCOUNTER — Encounter: Payer: Self-pay | Admitting: Family Medicine

## 2012-06-26 VITALS — BP 172/100 | Temp 98.4°F | Wt 328.4 lb

## 2012-06-26 DIAGNOSIS — G609 Hereditary and idiopathic neuropathy, unspecified: Secondary | ICD-10-CM

## 2012-06-26 DIAGNOSIS — N529 Male erectile dysfunction, unspecified: Secondary | ICD-10-CM

## 2012-06-26 DIAGNOSIS — I872 Venous insufficiency (chronic) (peripheral): Secondary | ICD-10-CM

## 2012-06-26 DIAGNOSIS — E1149 Type 2 diabetes mellitus with other diabetic neurological complication: Secondary | ICD-10-CM

## 2012-06-26 DIAGNOSIS — J019 Acute sinusitis, unspecified: Secondary | ICD-10-CM

## 2012-06-26 DIAGNOSIS — I10 Essential (primary) hypertension: Secondary | ICD-10-CM

## 2012-06-26 DIAGNOSIS — R809 Proteinuria, unspecified: Secondary | ICD-10-CM

## 2012-06-26 DIAGNOSIS — E1165 Type 2 diabetes mellitus with hyperglycemia: Secondary | ICD-10-CM

## 2012-06-26 DIAGNOSIS — E1129 Type 2 diabetes mellitus with other diabetic kidney complication: Secondary | ICD-10-CM

## 2012-06-26 DIAGNOSIS — G629 Polyneuropathy, unspecified: Secondary | ICD-10-CM

## 2012-06-26 DIAGNOSIS — IMO0001 Reserved for inherently not codable concepts without codable children: Secondary | ICD-10-CM

## 2012-06-26 DIAGNOSIS — E785 Hyperlipidemia, unspecified: Secondary | ICD-10-CM

## 2012-06-26 DIAGNOSIS — I878 Other specified disorders of veins: Secondary | ICD-10-CM

## 2012-06-26 MED ORDER — DOXYCYCLINE HYCLATE 100 MG PO TABS: 100.0000 mg | ORAL_TABLET | Freq: Two times a day (BID) | ORAL | Status: DC

## 2012-06-26 NOTE — Patient Instructions (Addendum)
Take all the antibiotics up 

## 2012-06-26 NOTE — Progress Notes (Signed)
  Subjective:    Patient ID: Brandon Villarreal, male    DOB: [birth year], 62 y.o.   MRN: [id number]  Sinus Problem This is a new problem. The current episode started yesterday. The problem has been gradually worsening since onset. The maximum temperature recorded prior to his arrival was 103 - 104 F. The fever has been present for 1 to 2 days. His pain is at a severity of 5/10. The pain is moderate. Associated symptoms include headaches and sinus pressure. Pertinent negatives include no chills. Past treatments include acetaminophen. The treatment provided mild relief.      Review of Systems  Constitutional: Negative for chills.  HENT: Positive for sinus pressure.   Neurological: Positive for headaches.       Objective:   Physical Exam  Alert mild malaise. Hydration decent. Vitals stable blood pressure improved on repeat 138/82. Lungs clear. Heart regular rate and rhythm. HEENT moderate nasal congestion frontal tenderness.      Assessment & Plan:  Impression #1 acute sinusitis. Plan symptomatic care discussed. Doxy 100 twice a day for 10 days. Warning signs discussed. WSL

## 2012-06-28 DIAGNOSIS — E119 Type 2 diabetes mellitus without complications: Secondary | ICD-10-CM | POA: Insufficient documentation

## 2012-06-28 DIAGNOSIS — E785 Hyperlipidemia, unspecified: Secondary | ICD-10-CM | POA: Insufficient documentation

## 2012-06-28 DIAGNOSIS — I10 Essential (primary) hypertension: Secondary | ICD-10-CM | POA: Insufficient documentation

## 2012-06-28 DIAGNOSIS — I878 Other specified disorders of veins: Secondary | ICD-10-CM | POA: Insufficient documentation

## 2012-06-28 DIAGNOSIS — G629 Polyneuropathy, unspecified: Secondary | ICD-10-CM | POA: Insufficient documentation

## 2012-06-28 DIAGNOSIS — N529 Male erectile dysfunction, unspecified: Secondary | ICD-10-CM | POA: Insufficient documentation

## 2012-06-28 DIAGNOSIS — IMO0001 Reserved for inherently not codable concepts without codable children: Secondary | ICD-10-CM | POA: Insufficient documentation

## 2012-07-01 ENCOUNTER — Other Ambulatory Visit: Payer: Self-pay | Admitting: Family Medicine

## 2012-07-03 ENCOUNTER — Encounter: Payer: Self-pay | Admitting: *Deleted

## 2012-07-03 ENCOUNTER — Other Ambulatory Visit: Payer: Self-pay | Admitting: Family Medicine

## 2012-07-09 ENCOUNTER — Ambulatory Visit: Payer: 59 | Admitting: Family Medicine

## 2012-07-30 ENCOUNTER — Other Ambulatory Visit: Payer: Self-pay | Admitting: Family Medicine

## 2012-08-17 ENCOUNTER — Other Ambulatory Visit: Payer: Self-pay | Admitting: Family Medicine

## 2012-08-17 MED ORDER — INSULIN GLARGINE 100 UNIT/ML ~~LOC~~ SOLN: SUBCUTANEOUS | Status: DC

## 2012-08-17 NOTE — Telephone Encounter (Signed)
Patient states he needs prescription for Lantus with the new dosage of 98 Units daily sent to [facility].  He is currently out of Lantus.  Please call patient.  Thanks

## 2012-08-17 NOTE — Telephone Encounter (Signed)
RX for lantus sent into [facility]. Left message on voicemail notifying patient.

## 2012-08-20 ENCOUNTER — Other Ambulatory Visit: Payer: Self-pay

## 2012-08-20 MED ORDER — INSULIN GLARGINE 100 UNIT/ML ~~LOC~~ SOLN: SUBCUTANEOUS | Status: DC

## 2012-09-02 ENCOUNTER — Other Ambulatory Visit: Payer: Self-pay | Admitting: Family Medicine

## 2012-11-06 ENCOUNTER — Ambulatory Visit (INDEPENDENT_AMBULATORY_CARE_PROVIDER_SITE_OTHER): Payer: 59 | Admitting: Family Medicine

## 2012-11-06 ENCOUNTER — Encounter: Payer: Self-pay | Admitting: Family Medicine

## 2012-11-06 VITALS — Ht 74.0 in | Wt 328.0 lb

## 2012-11-06 DIAGNOSIS — E785 Hyperlipidemia, unspecified: Secondary | ICD-10-CM

## 2012-11-06 DIAGNOSIS — Z23 Encounter for immunization: Secondary | ICD-10-CM

## 2012-11-06 DIAGNOSIS — R809 Proteinuria, unspecified: Secondary | ICD-10-CM

## 2012-11-06 DIAGNOSIS — E1129 Type 2 diabetes mellitus with other diabetic kidney complication: Secondary | ICD-10-CM

## 2012-11-06 DIAGNOSIS — Z125 Encounter for screening for malignant neoplasm of prostate: Secondary | ICD-10-CM

## 2012-11-06 DIAGNOSIS — IMO0001 Reserved for inherently not codable concepts without codable children: Secondary | ICD-10-CM

## 2012-11-06 DIAGNOSIS — E119 Type 2 diabetes mellitus without complications: Secondary | ICD-10-CM

## 2012-11-06 DIAGNOSIS — I10 Essential (primary) hypertension: Secondary | ICD-10-CM

## 2012-11-06 MED ORDER — POTASSIUM CHLORIDE CRYS ER 20 MEQ PO TBCR: EXTENDED_RELEASE_TABLET | ORAL | Status: DC

## 2012-11-06 MED ORDER — GLYBURIDE-METFORMIN 5-500 MG PO TABS: ORAL_TABLET | ORAL | Status: DC

## 2012-11-06 MED ORDER — AMLODIPINE BESYLATE 10 MG PO TABS: ORAL_TABLET | ORAL | Status: DC

## 2012-11-06 MED ORDER — DOXAZOSIN MESYLATE 2 MG PO TABS: 2.0000 mg | ORAL_TABLET | Freq: Every day | ORAL | Status: DC

## 2012-11-06 MED ORDER — BENAZEPRIL HCL 40 MG PO TABS: ORAL_TABLET | ORAL | Status: DC

## 2012-11-06 MED ORDER — PRAVASTATIN SODIUM 20 MG PO TABS: 20.0000 mg | ORAL_TABLET | Freq: Every evening | ORAL | Status: DC

## 2012-11-06 MED ORDER — INSULIN GLARGINE 100 UNIT/ML ~~LOC~~ SOLN: SUBCUTANEOUS | Status: DC

## 2012-11-06 MED ORDER — CANAGLIFLOZIN 100 MG PO TABS: 1.0000 | ORAL_TABLET | Freq: Every morning | ORAL | Status: DC

## 2012-11-06 MED ORDER — FUROSEMIDE 40 MG PO TABS: ORAL_TABLET | ORAL | Status: DC

## 2012-11-06 NOTE — Patient Instructions (Signed)
DASH Diet  The DASH diet stands for "Dietary Approaches to Stop Hypertension." It is a healthy eating plan that has been shown to reduce high blood pressure (hypertension) in as little as 14 days, while also possibly providing other significant health benefits. These other health benefits include reducing the risk of breast cancer after menopause and reducing the risk of type 2 diabetes, heart disease, colon cancer, and stroke. Health benefits also include weight loss and slowing kidney failure in patients with chronic kidney disease.   DIET GUIDELINES  · Limit salt (sodium). Your diet should contain less than 1500 mg of sodium daily.  · Limit refined or processed carbohydrates. Your diet should include mostly whole grains. Desserts and added sugars should be used sparingly.  · Include small amounts of heart-healthy fats. These types of fats include nuts, oils, and tub margarine. Limit saturated and trans fats. These fats have been shown to be harmful in the body.  CHOOSING FOODS   The following food groups are based on a 2000 calorie diet. See your Registered Dietitian for individual calorie needs.  Grains and Grain Products (6 to 8 servings daily)  · Eat More Often: Whole-wheat bread, brown rice, whole-grain or wheat pasta, quinoa, popcorn without added fat or salt (air popped).  · Eat Less Often: White bread, white pasta, white rice, cornbread.  Vegetables (4 to 5 servings daily)  · Eat More Often: Fresh, frozen, and canned vegetables. Vegetables may be raw, steamed, roasted, or grilled with a minimal amount of fat.  · Eat Less Often/Avoid: Creamed or fried vegetables. Vegetables in a cheese sauce.  Fruit (4 to 5 servings daily)  · Eat More Often: All fresh, canned (in natural juice), or frozen fruits. Dried fruits without added sugar. One hundred percent fruit juice (½ cup [237 mL] daily).  · Eat Less Often: Dried fruits with added sugar. Canned fruit in light or heavy syrup.  Lean Meats, Fish, and Poultry (2  servings or less daily. One serving is 3 to 4 oz [85-114 g]).  · Eat More Often: Ninety percent or leaner ground beef, tenderloin, sirloin. Round cuts of beef, chicken breast, turkey breast. All fish. Grill, bake, or broil your meat. Nothing should be fried.  · Eat Less Often/Avoid: Fatty cuts of meat, turkey, or chicken leg, thigh, or wing. Fried cuts of meat or fish.  Dairy (2 to 3 servings)  · Eat More Often: Low-fat or fat-free milk, low-fat plain or light yogurt, reduced-fat or part-skim cheese.  · Eat Less Often/Avoid: Milk (whole, 2%). Whole milk yogurt. Full-fat cheeses.  Nuts, Seeds, and Legumes (4 to 5 servings per week)  · Eat More Often: All without added salt.  · Eat Less Often/Avoid: Salted nuts and seeds, canned beans with added salt.  Fats and Sweets (limited)  · Eat More Often: Vegetable oils, tub margarines without trans fats, sugar-free gelatin. Mayonnaise and salad dressings.  · Eat Less Often/Avoid: Coconut oils, palm oils, butter, stick margarine, cream, half and half, cookies, candy, pie.  FOR MORE INFORMATION  The Dash Diet Eating Plan: www.dashdiet.org  Document Released: 01/06/2011 Document Revised: 04/11/2011 Document Reviewed: 01/06/2011  ExitCare® Patient Information ©2014 [employer].

## 2012-11-06 NOTE — Progress Notes (Addendum)
  Subjective:    Patient ID: Brandon Villarreal, male    DOB: [birth year], 62 y.o.   MRN: [id number]  HPI Patient arrives for a follow up on diabetes and hypertension. The patient was seen today as part of a comprehensive diabetic check up. The patient had the following elements completed: -Review of medication compliance -Review of glucose monitoring results -Review of any complications do to high or low sugars -Diabetic foot exam was completed as part of today's visit. The following was also discussed: -Importance of yearly eye exams -Importance of following diabetic/low sugar-starch diet -Importance of exercise and regular activity -Importance of regular followup visits. -Most recent hemoglobin A1c were reviewed with the patient along with goals regarding diabetes. Long discussion was held with patient regarding diet physical activity also discussing his past medical history and reducing the risk of heart attacks and strokes  He relates he will be getting eye exam in the near future Review of Systems He denies blurred vision relates some increasing urinary frequency denies chest pressure tightness pain denies rectal bleeding hematuria    Objective:   Physical Exam Eardrums are normal eyes normal lungs clear heart regular neck supple abdomen soft extremities no edema skin warm dry neurologic grossly normal diabetic foot exam was completed.       Assessment & Plan:  #1 diabetes-subpar control. Start Invokana 100 mg 1 daily. Continue his other medications. Watch diet closely. Try to stay as physically active as possible. Recheck hemoglobin A1c in 3 months #2 hyperlipidemia subpar control somehow he is soft pravastatin recent lab work showed LDL mildly elevated triglycerides very good. Stop fenofibrate. Start Pravachol 20 mg daily. Check lipid liver profile in 12 weeks with followup #3 hypertension subpar control start Cardura 2 mg each bedtime along with his other medication. Check metabolic  7 and urine micro-protein #4 peripheral neuropathy cautioned regarding foot care discussed with patient. He is seeing podiatrist for a hammertoe on the left foot currently. #5 advise patient get screening colonoscopy this fall he stated he will #6 patient will need prostate exam on followup.

## 2013-02-07 ENCOUNTER — Ambulatory Visit: Payer: 59 | Admitting: Family Medicine

## 2013-03-06 ENCOUNTER — Other Ambulatory Visit: Payer: Self-pay | Admitting: Family Medicine

## 2013-04-26 ENCOUNTER — Telehealth: Payer: Self-pay | Admitting: Family Medicine

## 2013-04-26 ENCOUNTER — Ambulatory Visit (INDEPENDENT_AMBULATORY_CARE_PROVIDER_SITE_OTHER): Payer: 59 | Admitting: Family Medicine

## 2013-04-26 ENCOUNTER — Encounter: Payer: Self-pay | Admitting: Family Medicine

## 2013-04-26 VITALS — BP 150/90 | Ht 74.0 in | Wt 320.0 lb

## 2013-04-26 DIAGNOSIS — IMO0002 Reserved for concepts with insufficient information to code with codable children: Secondary | ICD-10-CM

## 2013-04-26 DIAGNOSIS — Z79899 Other long term (current) drug therapy: Secondary | ICD-10-CM

## 2013-04-26 DIAGNOSIS — E119 Type 2 diabetes mellitus without complications: Secondary | ICD-10-CM

## 2013-04-26 DIAGNOSIS — Z125 Encounter for screening for malignant neoplasm of prostate: Secondary | ICD-10-CM

## 2013-04-26 DIAGNOSIS — M1711 Unilateral primary osteoarthritis, right knee: Secondary | ICD-10-CM

## 2013-04-26 DIAGNOSIS — M1710 Unilateral primary osteoarthritis, unspecified knee: Secondary | ICD-10-CM

## 2013-04-26 DIAGNOSIS — E785 Hyperlipidemia, unspecified: Secondary | ICD-10-CM

## 2013-04-26 LAB — POCT GLYCOSYLATED HEMOGLOBIN (HGB A1C): HEMOGLOBIN A1C: 6.7

## 2013-04-26 MED ORDER — AMLODIPINE BESYLATE 10 MG PO TABS: ORAL_TABLET | ORAL | Status: DC

## 2013-04-26 MED ORDER — GLYBURIDE-METFORMIN 5-500 MG PO TABS: ORAL_TABLET | ORAL | Status: DC

## 2013-04-26 MED ORDER — FUROSEMIDE 40 MG PO TABS: ORAL_TABLET | ORAL | Status: DC

## 2013-04-26 MED ORDER — DOXAZOSIN MESYLATE 2 MG PO TABS: 2.0000 mg | ORAL_TABLET | Freq: Every day | ORAL | Status: DC

## 2013-04-26 MED ORDER — INSULIN GLARGINE 100 UNIT/ML ~~LOC~~ SOLN: SUBCUTANEOUS | Status: DC

## 2013-04-26 MED ORDER — POTASSIUM CHLORIDE CRYS ER 20 MEQ PO TBCR: EXTENDED_RELEASE_TABLET | ORAL | Status: DC

## 2013-04-26 MED ORDER — BENAZEPRIL HCL 40 MG PO TABS: ORAL_TABLET | ORAL | Status: DC

## 2013-04-26 MED ORDER — PRAVASTATIN SODIUM 20 MG PO TABS: 20.0000 mg | ORAL_TABLET | Freq: Every evening | ORAL | Status: DC

## 2013-04-26 MED ORDER — CANAGLIFLOZIN 100 MG PO TABS: 1.0000 | ORAL_TABLET | Freq: Every morning | ORAL | Status: DC

## 2013-04-26 MED ORDER — FENOFIBRATE 160 MG PO TABS: ORAL_TABLET | ORAL | Status: DC

## 2013-04-26 NOTE — Progress Notes (Signed)
   Subjective:    Patient ID: Brandon KettleRobert F Handshoe, male    DOB: 04/20/1950, 63 y.o.   MRN: [id number]  Diabetes He presents for his follow-up diabetic visit. He has type 2 diabetes mellitus. His disease course has been stable. There are no hypoglycemic associated symptoms. There are no diabetic associated symptoms. There are no hypoglycemic complications. Symptoms are stable. There are no diabetic complications. There are no known risk factors for coronary artery disease. Current diabetic treatment includes insulin injections. He is compliant with treatment all of the time.    Results for orders placed in visit on 04/26/13  POCT GLYCOSYLATED HEMOGLOBIN (HGB A1C)      Result Value Ref Range   Hemoglobin A1C 6.7     Working hard on diet and exercise not so good Patient states that he wants to discuss his right knee pain. He believes he may need to have a knee replacement. A1C today is 6.7.  Patient claims compliance with his blood pressure medication. Trying to watch his salt intake. No obvious side effects from the medication.  Progressive knee pain unfortunately. 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 He thinks he needs it on the right. Maj. discomfort and pain as he gets around.  Has worked very hard on his diet compared to usual see prior notes. rendall rep left kene would like a referral to Dr. Valentina GuLucy.  Patient notes is he is compliant with cholesterol medicine. And triglyceride medicine. No obvious side effects. Has not missed a dose. Working on his fat diet. Review of Systems No headache or chest pain no back pain no abdominal pain no change in bowel habits no blood in stool ROS otherwise    Objective:   Physical Exam Alert no apparent distress. HEENT normal. Lungs clear. Heart regular in rhythm. Ankles mild edema.  See foot exam     Assessment & Plan:  Impression 1 type 2 diabetes control much improved. #2 hypertension good control. #3 hyperlipidemia status uncertain discuss #4  progressive knee arthritis. In need of referral. #5 chronic venous stasis currently moderately stable plan referral as noted. Appropriate blood work. Medications refilled. Diet exercise discussed. Followup as scheduled. Strongly encouraged to work on diet and exercise. Easily 35-40 minutes spent most in discussion. Your wellness exam in 3 months WSL

## 2013-04-26 NOTE — Telephone Encounter (Signed)
Patient wants to change the referral for his knee specialist to Dr Valentina GuLucy, not Antony OdeaLucio.

## 2013-04-26 NOTE — Patient Instructions (Signed)
Please get back on daily baby aspirin 81 mg

## 2013-04-28 ENCOUNTER — Encounter: Payer: Self-pay | Admitting: Family Medicine

## 2013-05-11 LAB — HEPATIC FUNCTION PANEL
ALBUMIN: 3.7 g/dL (ref 3.5–5.2)
ALT: 18 U/L (ref 0–53)
AST: 13 U/L (ref 0–37)
Alkaline Phosphatase: 82 U/L (ref 39–117)
BILIRUBIN TOTAL: 0.7 mg/dL (ref 0.2–1.2)
Bilirubin, Direct: 0.1 mg/dL (ref 0.0–0.3)
Indirect Bilirubin: 0.6 mg/dL (ref 0.2–1.2)
Total Protein: 6.1 g/dL (ref 6.0–8.3)

## 2013-05-11 LAB — BASIC METABOLIC PANEL
BUN: 12 mg/dL (ref 6–23)
CO2: 28 mEq/L (ref 19–32)
Calcium: 8.9 mg/dL (ref 8.4–10.5)
Chloride: 105 mEq/L (ref 96–112)
Creat: 0.57 mg/dL (ref 0.50–1.35)
Glucose, Bld: 104 mg/dL — ABNORMAL HIGH (ref 70–99)
Potassium: 4.3 mEq/L (ref 3.5–5.3)
Sodium: 141 mEq/L (ref 135–145)

## 2013-05-11 LAB — LIPID PANEL
Cholesterol: 150 mg/dL (ref 0–200)
HDL: 32 mg/dL — AB (ref >39)
LDL Cholesterol: 100 mg/dL — ABNORMAL HIGH (ref 0–99)
TRIGLYCERIDES: 92 mg/dL (ref <150)
Total CHOL/HDL Ratio: 4.7 Ratio
VLDL: 18 mg/dL (ref 0–40)

## 2013-05-12 LAB — PSA: PSA: 0.58 ng/mL (ref <=4.00)

## 2013-05-12 LAB — MICROALBUMIN, URINE: Microalb, Ur: 3.87 mg/dL — ABNORMAL HIGH (ref 0.00–1.89)

## 2013-05-13 ENCOUNTER — Encounter: Payer: Self-pay | Admitting: Family Medicine

## 2013-05-14 ENCOUNTER — Telehealth: Payer: Self-pay | Admitting: Family Medicine

## 2013-05-14 NOTE — Telephone Encounter (Signed)
See attached to chart pre op clearance  Advised pt he may need an appt  Last seen 04/26/13

## 2013-05-20 ENCOUNTER — Telehealth: Payer: Self-pay | Admitting: Family Medicine

## 2013-05-20 NOTE — Telephone Encounter (Signed)
Pt calling to check on the status of his pre op Clearance

## 2013-05-23 NOTE — Telephone Encounter (Signed)
Pt notifed on voicemail Form ready for pickup.

## 2013-05-23 NOTE — Telephone Encounter (Signed)
done

## 2013-06-05 ENCOUNTER — Other Ambulatory Visit: Payer: Self-pay | Admitting: Orthopedic Surgery

## 2013-07-18 ENCOUNTER — Encounter (HOSPITAL_COMMUNITY): Payer: Self-pay | Admitting: Pharmacy Technician

## 2013-07-18 NOTE — Pre-Procedure Instructions (Addendum)
Brandon KettleRobert F Villarreal  07/18/2013   Your procedure is scheduled on:  Monday, June 29.  [facility] at 6:45 AM.  Call this number if you have problems the morning of surgery: [phone]   Remember:   Do not eat food or drink liquids after midnight Sunday, June 28.   Take these medicines the morning of surgery with A SIP OF WATER: amLODipine (NORVASC),  doxazosin (CARDURA).   No diabetes medication on morning of surgery               Stop taking 1 week prior to Surgery. Aspirin, ibuprofen (ADVIL,MOTRIN).   Do not wear jewelry.  Do not wear lotions, powders, or perfumes.    Men may shave face and neck.  Do not bring valuables to the hospital.             [facility] is not responsible  for any belongings or valuables.               Contacts, dentures or bridgework may not be worn into surgery.  Leave suitcase in the car. After surgery it may be brought to your room.  For patients admitted to the hospital, discharge time is determined by your treatment team.                 Special Instructions: -   Please read over the following fact sheets that you were given: Pain Booklet, Coughing and Deep Breathing and Surgical Site Infection Prevention and Incentive Spirometer

## 2013-07-19 ENCOUNTER — Encounter (HOSPITAL_COMMUNITY)
Admission: RE | Admit: 2013-07-19 | Discharge: 2013-07-19 | Disposition: A | Discharge status: Home / Self Care | Payer: 59 | Source: Ambulatory Visit | Attending: Orthopedic Surgery | Admitting: Orthopedic Surgery

## 2013-07-19 ENCOUNTER — Encounter (HOSPITAL_COMMUNITY): Payer: Self-pay

## 2013-07-19 DIAGNOSIS — Z01818 Encounter for other preprocedural examination: Secondary | ICD-10-CM | POA: Insufficient documentation

## 2013-07-19 DIAGNOSIS — Z01810 Encounter for preprocedural cardiovascular examination: Secondary | ICD-10-CM | POA: Insufficient documentation

## 2013-07-19 DIAGNOSIS — Z01812 Encounter for preprocedural laboratory examination: Secondary | ICD-10-CM | POA: Insufficient documentation

## 2013-07-19 HISTORY — DX: Unspecified osteoarthritis, unspecified site: M19.90

## 2013-07-19 LAB — CBC WITH DIFFERENTIAL/PLATELET
BASOS PCT: 0 % (ref 0–1)
Basophils Absolute: 0 10*3/uL (ref 0.0–0.1)
Eosinophils Absolute: 0.4 10*3/uL (ref 0.0–0.7)
Eosinophils Relative: 5 % (ref 0–5)
HCT: 43.8 % (ref 39.0–52.0)
Hemoglobin: 15.3 g/dL (ref 13.0–17.0)
LYMPHS ABS: 2.2 10*3/uL (ref 0.7–4.0)
Lymphocytes Relative: 23 % (ref 12–46)
MCH: 28.6 pg (ref 26.0–34.0)
MCHC: 34.9 g/dL (ref 30.0–36.0)
MCV: 81.9 fL (ref 78.0–100.0)
MONOS PCT: 10 % (ref 3–12)
Monocytes Absolute: 1 10*3/uL (ref 0.1–1.0)
NEUTROS ABS: 5.9 10*3/uL (ref 1.7–7.7)
NEUTROS PCT: 62 % (ref 43–77)
Platelets: 169 10*3/uL (ref 150–400)
RBC: 5.35 MIL/uL (ref 4.22–5.81)
RDW: 14.3 % (ref 11.5–15.5)
WBC: 9.5 10*3/uL (ref 4.0–10.5)

## 2013-07-19 LAB — URINALYSIS, ROUTINE W REFLEX MICROSCOPIC
BILIRUBIN URINE: NEGATIVE
Glucose, UA: >1000 mg/dL — AB
Hgb urine dipstick: NEGATIVE
Ketones, ur: NEGATIVE mg/dL
Leukocytes, UA: NEGATIVE
Nitrite: NEGATIVE
PH: 6 (ref 5.0–8.0)
Protein, ur: NEGATIVE mg/dL
Specific Gravity, Urine: 1.028 (ref 1.005–1.030)
UROBILINOGEN UA: 0.2 mg/dL (ref 0.0–1.0)

## 2013-07-19 LAB — COMPREHENSIVE METABOLIC PANEL
ALBUMIN: 4.2 g/dL (ref 3.5–5.2)
ALK PHOS: 93 U/L (ref 39–117)
ALT: 19 U/L (ref 0–53)
AST: 17 U/L (ref 0–37)
BILIRUBIN TOTAL: 0.7 mg/dL (ref 0.3–1.2)
BUN: 12 mg/dL (ref 6–23)
CHLORIDE: 101 meq/L (ref 96–112)
CO2: 27 mEq/L (ref 19–32)
Calcium: 9.4 mg/dL (ref 8.4–10.5)
Creatinine, Ser: 0.6 mg/dL (ref 0.50–1.35)
GFR calc Af Amer: >90 mL/min (ref >90)
GFR calc non Af Amer: >90 mL/min (ref >90)
GLUCOSE: 173 mg/dL — AB (ref 70–99)
POTASSIUM: 4.5 meq/L (ref 3.7–5.3)
SODIUM: 143 meq/L (ref 137–147)
Total Protein: 7.3 g/dL (ref 6.0–8.3)

## 2013-07-19 LAB — TYPE AND SCREEN
ABO/RH(D): B NEG
Antibody Screen: NEGATIVE

## 2013-07-19 LAB — PROTIME-INR
INR: 0.99 (ref 0.00–1.49)
PROTHROMBIN TIME: 12.9 s (ref 11.6–15.2)

## 2013-07-19 LAB — SURGICAL PCR SCREEN
MRSA, PCR: NEGATIVE
STAPHYLOCOCCUS AUREUS: NEGATIVE

## 2013-07-19 LAB — APTT: APTT: 29 s (ref 24–37)

## 2013-07-19 LAB — URINE MICROSCOPIC-ADD ON

## 2013-07-19 LAB — ABO/RH: ABO/RH(D): B NEG

## 2013-07-19 NOTE — Progress Notes (Signed)
Primary physician - dr. Cathlyn Parsonslukings Does not have a cardiologist No recent cardiac testing

## 2013-07-19 NOTE — Progress Notes (Signed)
07/19/13 0814  OBSTRUCTIVE SLEEP APNEA  Have you ever been diagnosed with sleep apnea through a sleep study? No  Do you snore loudly (loud enough to be heard through closed doors)?  1  Do you often feel tired, fatigued, or sleepy during the daytime? 0  Has anyone observed you stop breathing during your sleep? 0  Do you have, or are you being treated for high blood pressure? 1  BMI more than 35 kg/m2? 1  Age over 62 years old? 1  Neck circumference greater than 40 cm/16 inches? 1  Gender: 1  Obstructive Sleep Apnea Score 6  Score 4 or greater  Results sent to PCP

## 2013-07-20 LAB — URINE CULTURE

## 2013-07-22 ENCOUNTER — Ambulatory Visit (INDEPENDENT_AMBULATORY_CARE_PROVIDER_SITE_OTHER): Payer: 59 | Admitting: Family Medicine

## 2013-07-22 ENCOUNTER — Encounter: Payer: Self-pay | Admitting: Family Medicine

## 2013-07-22 VITALS — BP 160/90 | Ht 74.0 in | Wt 337.0 lb

## 2013-07-22 DIAGNOSIS — I878 Other specified disorders of veins: Secondary | ICD-10-CM

## 2013-07-22 DIAGNOSIS — G609 Hereditary and idiopathic neuropathy, unspecified: Secondary | ICD-10-CM

## 2013-07-22 DIAGNOSIS — I10 Essential (primary) hypertension: Secondary | ICD-10-CM

## 2013-07-22 DIAGNOSIS — E785 Hyperlipidemia, unspecified: Secondary | ICD-10-CM

## 2013-07-22 DIAGNOSIS — Z0000 Encounter for general adult medical examination without abnormal findings: Secondary | ICD-10-CM

## 2013-07-22 DIAGNOSIS — E1149 Type 2 diabetes mellitus with other diabetic neurological complication: Secondary | ICD-10-CM

## 2013-07-22 DIAGNOSIS — G629 Polyneuropathy, unspecified: Secondary | ICD-10-CM

## 2013-07-22 DIAGNOSIS — I872 Venous insufficiency (chronic) (peripheral): Secondary | ICD-10-CM

## 2013-07-22 MED ORDER — AMLODIPINE BESYLATE 10 MG PO TABS: 5.0000 mg | ORAL_TABLET | Freq: Two times a day (BID) | ORAL | Status: DC

## 2013-07-22 MED ORDER — INSULIN GLARGINE 100 UNIT/ML ~~LOC~~ SOLN: 98.0000 [IU] | Freq: Every day | SUBCUTANEOUS | Status: DC

## 2013-07-22 MED ORDER — PRAVASTATIN SODIUM 20 MG PO TABS: 20.0000 mg | ORAL_TABLET | Freq: Every evening | ORAL | Status: DC

## 2013-07-22 MED ORDER — FENOFIBRATE 160 MG PO TABS: 160.0000 mg | ORAL_TABLET | Freq: Every day | ORAL | Status: DC

## 2013-07-22 MED ORDER — GLYBURIDE-METFORMIN 5-500 MG PO TABS: 2.0000 | ORAL_TABLET | Freq: Two times a day (BID) | ORAL | Status: DC

## 2013-07-22 MED ORDER — BENAZEPRIL HCL 40 MG PO TABS: 40.0000 mg | ORAL_TABLET | Freq: Every day | ORAL | Status: DC

## 2013-07-22 MED ORDER — FUROSEMIDE 40 MG PO TABS: 40.0000 mg | ORAL_TABLET | Freq: Every day | ORAL | Status: DC

## 2013-07-22 MED ORDER — POTASSIUM CHLORIDE CRYS ER 20 MEQ PO TBCR: 20.0000 meq | EXTENDED_RELEASE_TABLET | Freq: Two times a day (BID) | ORAL | Status: DC

## 2013-07-22 MED ORDER — CANAGLIFLOZIN 100 MG PO TABS: 1.0000 | ORAL_TABLET | Freq: Every morning | ORAL | Status: DC

## 2013-07-22 NOTE — Progress Notes (Signed)
Anesthesia Chart Review:  Patient is a 63 year old male scheduled for right TKA on 07/29/13 by Dr. Sherlean FootLucey.  History includes DM2 with neuropathy, HTN, non-smoker, HLD, ED, venous stasis, arthritis, back surgery, left TKA '04, UHR, right hammer toe surgery '13. BMI is consistent with morbid obesity.  PCP is Dr. Vilinda BlanksWilliam S. Luking, last visit 07/22/13 with note not yet signed in Epic.  EKG on 07/19/13 showed NSR, LAD, anteroseptal infarct (age undetermined), possible anteroseptal infarct (age undetermined).  The interpreting cardiologist did not feel that it was significantly changed since prior tracing on 02/04/11.    Preoperative CXR and labs noted.   I reviewed above with anesthesiologist Dr. Krista BlueSinger.  Since EKG was felt stable would anticipate that he could proceed as planned if no acute changes.      Shonna Chockllison Zelenak, PA-C [facility]Anesthesiology Phone [phone] 07/22/2013 6:00 PM

## 2013-07-22 NOTE — Progress Notes (Signed)
Subjective:    Patient ID: Brandon KettleRobert F Villarreal, male    DOB: 02/11/1950, 63 y.o.   MRN: [id number]  HPI The patient comes in today for a wellness visit.  Results for orders placed during the hospital encounter of 07/19/13  SURGICAL PCR SCREEN      Result Value Ref Range   MRSA, PCR NEGATIVE  NEGATIVE   Staphylococcus aureus NEGATIVE  NEGATIVE  URINE CULTURE      Result Value Ref Range   Specimen Description URINE, CLEAN CATCH     Special Requests NONE     Culture  Setup Time       Value: 07/19/2013 12:52     Performed at [facility]   Colony Count       Value: 20,OOO COLONIES/ML     Performed at [facility]   Culture       Value: Multiple bacterial morphotypes present, none predominant. Suggest appropriate recollection if clinically indicated.     Performed at [facility]   Report Status 07/20/2013 FINAL    APTT      Result Value Ref Range   aPTT 29  24 - 37 seconds  CBC WITH DIFFERENTIAL      Result Value Ref Range   WBC 9.5  4.0 - 10.5 K/uL   RBC 5.35  4.22 - 5.81 MIL/uL   Hemoglobin 15.3  13.0 - 17.0 g/dL   HCT 08.643.8  57.839.0 - 46.952.0 %   MCV 81.9  78.0 - 100.0 fL   MCH 28.6  26.0 - 34.0 pg   MCHC 34.9  30.0 - 36.0 g/dL   RDW 62.914.3  52.811.5 - 41.315.5 %   Platelets 169  150 - 400 K/uL   Neutrophils Relative % 62  43 - 77 %   Neutro Abs 5.9  1.7 - 7.7 K/uL   Lymphocytes Relative 23  12 - 46 %   Lymphs Abs 2.2  0.7 - 4.0 K/uL   Monocytes Relative 10  3 - 12 %   Monocytes Absolute 1.0  0.1 - 1.0 K/uL   Eosinophils Relative 5  0 - 5 %   Eosinophils Absolute 0.4  0.0 - 0.7 K/uL   Basophils Relative 0  0 - 1 %   Basophils Absolute 0.0  0.0 - 0.1 K/uL  COMPREHENSIVE METABOLIC PANEL      Result Value Ref Range   Sodium 143  137 - 147 mEq/L   Potassium 4.5  3.7 - 5.3 mEq/L   Chloride 101  96 - 112 mEq/L   CO2 27  19 - 32 mEq/L   Glucose, Bld 173 (*) 70 - 99 mg/dL   BUN 12  6 - 23 mg/dL   Creatinine, Ser 2.440.60  0.50 - 1.35 mg/dL   Calcium 9.4  8.4 - 01.010.5  mg/dL   Total Protein 7.3  6.0 - 8.3 g/dL   Albumin 4.2  3.5 - 5.2 g/dL   AST 17  0 - 37 U/L   ALT 19  0 - 53 U/L   Alkaline Phosphatase 93  39 - 117 U/L   Total Bilirubin 0.7  0.3 - 1.2 mg/dL   GFR calc non Af Amer >90  >90 mL/min   GFR calc Af Amer >90  >90 mL/min  PROTIME-INR      Result Value Ref Range   Prothrombin Time 12.9  11.6 - 15.2 seconds   INR 0.99  0.00 - 1.49  URINALYSIS, ROUTINE W REFLEX MICROSCOPIC  Result Value Ref Range   Color, Urine YELLOW  YELLOW   APPearance CLEAR  CLEAR   Specific Gravity, Urine 1.028  1.005 - 1.030   pH 6.0  5.0 - 8.0   Glucose, UA >1000 (*) NEGATIVE mg/dL   Hgb urine dipstick NEGATIVE  NEGATIVE   Bilirubin Urine NEGATIVE  NEGATIVE   Ketones, ur NEGATIVE  NEGATIVE mg/dL   Protein, ur NEGATIVE  NEGATIVE mg/dL   Urobilinogen, UA 0.2  0.0 - 1.0 mg/dL   Nitrite NEGATIVE  NEGATIVE   Leukocytes, UA NEGATIVE  NEGATIVE  URINE MICROSCOPIC-ADD ON      Result Value Ref Range   Squamous Epithelial / LPF RARE  RARE   WBC, UA 0-2  <3 WBC/hpf   Bacteria, UA RARE  RARE  TYPE AND SCREEN      Result Value Ref Range   ABO/RH(D) B NEG     Antibody Screen NEG     Sample Expiration 08/02/2013    ABO/RH      Result Value Ref Range   ABO/RH(D) B NEG       A review of their health history was completed.  A review of medications was also completed.  Any needed refills; none today  Eating habits: good  Falls/  MVA accidents in past few months: no  Regular exercise: n/a  Specialist pt sees on regular basis: Sports medicine doctor for knee replacement. Patient due to have knee surgery very soon.  Preventative health issues were discussed.   Additional concerns: none   Review of Systems  Constitutional: Negative for fever, activity change and appetite change.       Ongoing challenges with substantial weight gain  HENT: Negative for congestion and rhinorrhea.   Eyes: Negative for discharge.  Respiratory: Negative for cough and wheezing.    Cardiovascular: Negative for chest pain.  Gastrointestinal: Negative for vomiting, abdominal pain and blood in stool.  Genitourinary: Negative for frequency and difficulty urinating.  Musculoskeletal: Negative for neck pain.       Bilateral significant knee pain  Skin: Negative for rash.  Allergic/Immunologic: Negative for environmental allergies and food allergies.  Neurological: Negative for weakness and headaches.  Psychiatric/Behavioral: Negative for agitation.  All other systems reviewed and are negative.      Objective:   Physical Exam  Vitals reviewed. Constitutional: He appears well-developed and well-nourished.  Obesity present.  HENT:  Head: Normocephalic and atraumatic.  Right Ear: External ear normal.  Left Ear: External ear normal.  Nose: Nose normal.  Mouth/Throat: Oropharynx is clear and moist.  Eyes: EOM are normal. Pupils are equal, round, and reactive to light.  Neck: Normal range of motion. Neck supple. No thyromegaly present.  Cardiovascular: Normal rate, regular rhythm and normal heart sounds.   No murmur heard. Pulmonary/Chest: Effort normal and breath sounds normal. No respiratory distress. He has no wheezes.  Abdominal: Soft. Bowel sounds are normal. He exhibits no distension and no mass. There is no tenderness.  Genitourinary: Penis normal.  Musculoskeletal: Normal range of motion. He exhibits no edema.  Substantial crepitations and pain with extension of knee  Lymphadenopathy:    He has no cervical adenopathy.  Neurological: He is alert. He exhibits normal muscle tone.  Skin: Skin is warm and dry. No erythema.  Psychiatric: He has a normal mood and affect. His behavior is normal. Judgment normal.          Assessment & Plan:  Impression 1 wellness exam. #2 morbid obesity discussed. Have advised bariatric  referral in past. Patient reluctant still to do this. #3 progressive arthritis. #4 type 2 diabetes mellitus recent A1c good. #5 hyperlipidemia  #6 chronic venous stasis #7 micro-proteinuria. #8 sensory neuropathy plan press on with joint replacement. Patient will consider bariatric intervention down the road. Medications refilled. Recheck in several months. Diet exercise discussed. WSL

## 2013-07-28 ENCOUNTER — Encounter (HOSPITAL_COMMUNITY): Payer: Self-pay | Admitting: Certified Registered Nurse Anesthetist

## 2013-07-28 MED ORDER — DEXTROSE 5 % IV SOLN
3.0000 g | INTRAVENOUS | Status: AC
  Administered 2013-07-29: 3 g via INTRAVENOUS
  Filled 2013-07-28: qty 3000

## 2013-07-28 MED ORDER — SODIUM CHLORIDE 0.9 % IV SOLN
1000.0000 mg | INTRAVENOUS | Status: AC
  Administered 2013-07-29: 1000 mg via INTRAVENOUS
  Filled 2013-07-28: qty 10

## 2013-07-29 ENCOUNTER — Encounter (HOSPITAL_COMMUNITY)
Admission: RE | Disposition: A | Discharge status: Home Health | Payer: Self-pay | Source: Ambulatory Visit | Attending: Orthopedic Surgery

## 2013-07-29 ENCOUNTER — Encounter (HOSPITAL_COMMUNITY): Payer: Self-pay | Admitting: *Deleted

## 2013-07-29 ENCOUNTER — Encounter (HOSPITAL_COMMUNITY): Payer: 59 | Admitting: Vascular Surgery

## 2013-07-29 ENCOUNTER — Inpatient Hospital Stay (HOSPITAL_COMMUNITY)
Admission: RE | Admit: 2013-07-29 | Discharge: 2013-07-30 | DRG: 470 | Disposition: A | Discharge status: Home Health | Payer: 59 | Source: Ambulatory Visit | Attending: Orthopedic Surgery | Admitting: Orthopedic Surgery

## 2013-07-29 ENCOUNTER — Inpatient Hospital Stay (HOSPITAL_COMMUNITY): Payer: 59 | Admitting: Certified Registered Nurse Anesthetist

## 2013-07-29 DIAGNOSIS — Z7982 Long term (current) use of aspirin: Secondary | ICD-10-CM

## 2013-07-29 DIAGNOSIS — I10 Essential (primary) hypertension: Secondary | ICD-10-CM | POA: Diagnosis present

## 2013-07-29 DIAGNOSIS — E785 Hyperlipidemia, unspecified: Secondary | ICD-10-CM | POA: Diagnosis present

## 2013-07-29 DIAGNOSIS — Z794 Long term (current) use of insulin: Secondary | ICD-10-CM

## 2013-07-29 DIAGNOSIS — D62 Acute posthemorrhagic anemia: Secondary | ICD-10-CM | POA: Diagnosis not present

## 2013-07-29 DIAGNOSIS — M1710 Unilateral primary osteoarthritis, unspecified knee: Principal | ICD-10-CM | POA: Diagnosis present

## 2013-07-29 DIAGNOSIS — Z96651 Presence of right artificial knee joint: Secondary | ICD-10-CM

## 2013-07-29 DIAGNOSIS — Z96659 Presence of unspecified artificial knee joint: Secondary | ICD-10-CM

## 2013-07-29 DIAGNOSIS — IMO0001 Reserved for inherently not codable concepts without codable children: Secondary | ICD-10-CM | POA: Diagnosis present

## 2013-07-29 DIAGNOSIS — M25569 Pain in unspecified knee: Secondary | ICD-10-CM | POA: Diagnosis present

## 2013-07-29 DIAGNOSIS — E1165 Type 2 diabetes mellitus with hyperglycemia: Secondary | ICD-10-CM

## 2013-07-29 DIAGNOSIS — N529 Male erectile dysfunction, unspecified: Secondary | ICD-10-CM | POA: Diagnosis present

## 2013-07-29 DIAGNOSIS — Z79899 Other long term (current) drug therapy: Secondary | ICD-10-CM

## 2013-07-29 HISTORY — PX: TOTAL KNEE ARTHROPLASTY: SHX125

## 2013-07-29 LAB — CREATININE, SERUM
Creatinine, Ser: 0.71 mg/dL (ref 0.50–1.35)
GFR calc non Af Amer: >90 mL/min (ref >90)

## 2013-07-29 LAB — GLUCOSE, CAPILLARY
GLUCOSE-CAPILLARY: 124 mg/dL — AB (ref 70–99)
Glucose-Capillary: 146 mg/dL — ABNORMAL HIGH (ref 70–99)
Glucose-Capillary: 178 mg/dL — ABNORMAL HIGH (ref 70–99)
Glucose-Capillary: 179 mg/dL — ABNORMAL HIGH (ref 70–99)

## 2013-07-29 LAB — CBC
HEMATOCRIT: 37.4 % — AB (ref 39.0–52.0)
Hemoglobin: 12.5 g/dL — ABNORMAL LOW (ref 13.0–17.0)
MCH: 28.1 pg (ref 26.0–34.0)
MCHC: 33.4 g/dL (ref 30.0–36.0)
MCV: 84 fL (ref 78.0–100.0)
Platelets: 170 10*3/uL (ref 150–400)
RBC: 4.45 MIL/uL (ref 4.22–5.81)
RDW: 14.4 % (ref 11.5–15.5)
WBC: 12.5 10*3/uL — ABNORMAL HIGH (ref 4.0–10.5)

## 2013-07-29 LAB — HEMOGLOBIN A1C
Hgb A1c MFr Bld: 7.9 % — ABNORMAL HIGH (ref <5.7)
Mean Plasma Glucose: 180 mg/dL — ABNORMAL HIGH (ref <117)

## 2013-07-29 SURGERY — ARTHROPLASTY, KNEE, TOTAL
Anesthesia: Regional | Laterality: Right

## 2013-07-29 MED ORDER — BENAZEPRIL HCL 40 MG PO TABS
40.0000 mg | ORAL_TABLET | Freq: Every day | ORAL | Status: DC
  Administered 2013-07-30: 40 mg via ORAL
  Filled 2013-07-29: qty 1

## 2013-07-29 MED ORDER — HYDROMORPHONE HCL PF 1 MG/ML IJ SOLN
1.0000 mg | INTRAMUSCULAR | Status: DC | PRN
  Administered 2013-07-29 – 2013-07-30 (×4): 1 mg via INTRAVENOUS
  Filled 2013-07-29 (×4): qty 1

## 2013-07-29 MED ORDER — GLYBURIDE 5 MG PO TABS
10.0000 mg | ORAL_TABLET | Freq: Two times a day (BID) | ORAL | Status: DC
  Administered 2013-07-30 (×2): 10 mg via ORAL
  Filled 2013-07-29 (×4): qty 2

## 2013-07-29 MED ORDER — FENTANYL CITRATE 0.05 MG/ML IJ SOLN
INTRAMUSCULAR | Status: DC | PRN
  Administered 2013-07-29 (×10): 50 ug via INTRAVENOUS

## 2013-07-29 MED ORDER — ARTIFICIAL TEARS OP OINT
TOPICAL_OINTMENT | OPHTHALMIC | Status: AC
  Filled 2013-07-29: qty 3.5

## 2013-07-29 MED ORDER — ALUM & MAG HYDROXIDE-SIMETH 200-200-20 MG/5ML PO SUSP: 30.0000 mL | ORAL | Status: DC | PRN

## 2013-07-29 MED ORDER — INSULIN GLARGINE 100 UNIT/ML ~~LOC~~ SOLN
98.0000 [IU] | Freq: Every day | SUBCUTANEOUS | Status: DC
  Administered 2013-07-29: 98 [IU] via SUBCUTANEOUS
  Filled 2013-07-29 (×2): qty 0.98

## 2013-07-29 MED ORDER — PHENOL 1.4 % MT LIQD: 1.0000 | OROMUCOSAL | Status: DC | PRN

## 2013-07-29 MED ORDER — OXYCODONE HCL 5 MG PO TABS
ORAL_TABLET | ORAL | Status: AC
  Filled 2013-07-29: qty 2

## 2013-07-29 MED ORDER — SENNOSIDES-DOCUSATE SODIUM 8.6-50 MG PO TABS: 1.0000 | ORAL_TABLET | Freq: Every evening | ORAL | Status: DC | PRN

## 2013-07-29 MED ORDER — METFORMIN HCL 500 MG PO TABS
1000.0000 mg | ORAL_TABLET | Freq: Two times a day (BID) | ORAL | Status: DC
  Administered 2013-07-29 – 2013-07-30 (×3): 1000 mg via ORAL
  Filled 2013-07-29 (×4): qty 2

## 2013-07-29 MED ORDER — BUPIVACAINE-EPINEPHRINE (PF) 0.5% -1:200000 IJ SOLN
INTRAMUSCULAR | Status: AC
  Filled 2013-07-29: qty 30

## 2013-07-29 MED ORDER — OXYCODONE HCL 5 MG PO TABS
5.0000 mg | ORAL_TABLET | ORAL | Status: DC | PRN
  Administered 2013-07-29 – 2013-07-30 (×5): 10 mg via ORAL
  Administered 2013-07-30: 5 mg via ORAL
  Administered 2013-07-30 (×3): 10 mg via ORAL
  Filled 2013-07-29 (×8): qty 2

## 2013-07-29 MED ORDER — METHOCARBAMOL 500 MG PO TABS
500.0000 mg | ORAL_TABLET | Freq: Four times a day (QID) | ORAL | Status: DC | PRN
  Administered 2013-07-29 – 2013-07-30 (×4): 500 mg via ORAL
  Filled 2013-07-29 (×4): qty 1

## 2013-07-29 MED ORDER — DOCUSATE SODIUM 100 MG PO CAPS
100.0000 mg | ORAL_CAPSULE | Freq: Two times a day (BID) | ORAL | Status: DC
  Administered 2013-07-29 – 2013-07-30 (×3): 100 mg via ORAL
  Filled 2013-07-29 (×4): qty 1

## 2013-07-29 MED ORDER — ONDANSETRON HCL 4 MG PO TABS
4.0000 mg | ORAL_TABLET | Freq: Four times a day (QID) | ORAL | Status: DC | PRN
  Administered 2013-07-30: 4 mg via ORAL
  Filled 2013-07-29: qty 1

## 2013-07-29 MED ORDER — PROPOFOL 10 MG/ML IV BOLUS
INTRAVENOUS | Status: AC
  Filled 2013-07-29: qty 20

## 2013-07-29 MED ORDER — HYDROMORPHONE HCL PF 1 MG/ML IJ SOLN
INTRAMUSCULAR | Status: DC | PRN
  Administered 2013-07-29 (×2): 0.5 mg via INTRAVENOUS

## 2013-07-29 MED ORDER — METOCLOPRAMIDE HCL 10 MG PO TABS: 5.0000 mg | ORAL_TABLET | Freq: Three times a day (TID) | ORAL | Status: DC | PRN

## 2013-07-29 MED ORDER — FLEET ENEMA 7-19 GM/118ML RE ENEM
1.0000 | ENEMA | Freq: Once | RECTAL | Status: AC | PRN
Start: 2013-07-29 — End: 2013-07-29

## 2013-07-29 MED ORDER — DOXAZOSIN MESYLATE 2 MG PO TABS
2.0000 mg | ORAL_TABLET | Freq: Every day | ORAL | Status: DC
  Administered 2013-07-30: 2 mg via ORAL
  Filled 2013-07-29: qty 1

## 2013-07-29 MED ORDER — AMLODIPINE BESYLATE 5 MG PO TABS
5.0000 mg | ORAL_TABLET | Freq: Two times a day (BID) | ORAL | Status: DC
  Administered 2013-07-29 – 2013-07-30 (×2): 5 mg via ORAL
  Filled 2013-07-29 (×3): qty 1

## 2013-07-29 MED ORDER — CELECOXIB 200 MG PO CAPS
200.0000 mg | ORAL_CAPSULE | Freq: Two times a day (BID) | ORAL | Status: DC
  Administered 2013-07-29 – 2013-07-30 (×3): 200 mg via ORAL
  Filled 2013-07-29 (×4): qty 1

## 2013-07-29 MED ORDER — OXYCODONE HCL 5 MG/5ML PO SOLN: 5.0000 mg | Freq: Once | ORAL | Status: AC | PRN

## 2013-07-29 MED ORDER — SODIUM CHLORIDE 0.9 % IV SOLN: INTRAVENOUS | Status: DC

## 2013-07-29 MED ORDER — PHENYLEPHRINE 40 MCG/ML (10ML) SYRINGE FOR IV PUSH (FOR BLOOD PRESSURE SUPPORT)
PREFILLED_SYRINGE | INTRAVENOUS | Status: AC
  Filled 2013-07-29: qty 20

## 2013-07-29 MED ORDER — ONDANSETRON HCL 4 MG/2ML IJ SOLN: 4.0000 mg | Freq: Four times a day (QID) | INTRAMUSCULAR | Status: DC | PRN

## 2013-07-29 MED ORDER — METHOCARBAMOL 500 MG PO TABS
ORAL_TABLET | ORAL | Status: AC
  Filled 2013-07-29: qty 1

## 2013-07-29 MED ORDER — BISACODYL 5 MG PO TBEC
5.0000 mg | DELAYED_RELEASE_TABLET | Freq: Every day | ORAL | Status: DC | PRN
Start: 2013-07-29 — End: 2013-07-30

## 2013-07-29 MED ORDER — OXYCODONE HCL 5 MG PO TABS
5.0000 mg | ORAL_TABLET | Freq: Once | ORAL | Status: AC | PRN
  Administered 2013-07-29: 5 mg via ORAL

## 2013-07-29 MED ORDER — ONDANSETRON HCL 4 MG/2ML IJ SOLN
INTRAMUSCULAR | Status: AC
  Filled 2013-07-29: qty 2

## 2013-07-29 MED ORDER — MIDAZOLAM HCL 5 MG/ML IJ SOLN: 2.0000 mg | Freq: Once | INTRAMUSCULAR | Status: DC

## 2013-07-29 MED ORDER — ROPIVACAINE HCL 5 MG/ML IJ SOLN
INTRAMUSCULAR | Status: DC | PRN
  Administered 2013-07-29: 30 mL via PERINEURAL

## 2013-07-29 MED ORDER — BUPIVACAINE LIPOSOME 1.3 % IJ SUSP
INTRAMUSCULAR | Status: DC | PRN
  Administered 2013-07-29: 20 mL

## 2013-07-29 MED ORDER — SODIUM CHLORIDE 0.9 % IV SOLN
INTRAVENOUS | Status: DC
  Administered 2013-07-29: 75 mL/h via INTRAVENOUS
  Administered 2013-07-30: 03:00:00 via INTRAVENOUS

## 2013-07-29 MED ORDER — DEXTROSE 5 % IV SOLN
500.0000 mg | Freq: Four times a day (QID) | INTRAVENOUS | Status: DC | PRN
  Filled 2013-07-29: qty 5

## 2013-07-29 MED ORDER — ALBUMIN HUMAN 5 % IV SOLN
INTRAVENOUS | Status: DC | PRN
  Administered 2013-07-29: 11:00:00 via INTRAVENOUS

## 2013-07-29 MED ORDER — ROCURONIUM BROMIDE 50 MG/5ML IV SOLN
INTRAVENOUS | Status: AC
  Filled 2013-07-29: qty 1

## 2013-07-29 MED ORDER — METOCLOPRAMIDE HCL 5 MG/ML IJ SOLN: 5.0000 mg | Freq: Three times a day (TID) | INTRAMUSCULAR | Status: DC | PRN

## 2013-07-29 MED ORDER — MIDAZOLAM HCL 2 MG/2ML IJ SOLN
INTRAMUSCULAR | Status: AC
  Filled 2013-07-29: qty 2

## 2013-07-29 MED ORDER — PHENYLEPHRINE HCL 10 MG/ML IJ SOLN
INTRAMUSCULAR | Status: DC | PRN
  Administered 2013-07-29: 40 ug via INTRAVENOUS
  Administered 2013-07-29: 80 ug via INTRAVENOUS
  Administered 2013-07-29 (×3): 40 ug via INTRAVENOUS
  Administered 2013-07-29 (×6): 80 ug via INTRAVENOUS

## 2013-07-29 MED ORDER — FUROSEMIDE 40 MG PO TABS
40.0000 mg | ORAL_TABLET | Freq: Every day | ORAL | Status: DC
  Administered 2013-07-29 – 2013-07-30 (×2): 40 mg via ORAL
  Filled 2013-07-29 (×2): qty 1

## 2013-07-29 MED ORDER — FENTANYL CITRATE 0.05 MG/ML IJ SOLN
100.0000 ug | Freq: Once | INTRAMUSCULAR | Status: AC
  Administered 2013-07-29: 100 ug via INTRAVENOUS

## 2013-07-29 MED ORDER — HYDROMORPHONE HCL PF 1 MG/ML IJ SOLN
INTRAMUSCULAR | Status: AC
  Filled 2013-07-29: qty 1

## 2013-07-29 MED ORDER — ACETAMINOPHEN 325 MG PO TABS: 650.0000 mg | ORAL_TABLET | Freq: Four times a day (QID) | ORAL | Status: DC | PRN

## 2013-07-29 MED ORDER — LIDOCAINE HCL (CARDIAC) 20 MG/ML IV SOLN
INTRAVENOUS | Status: DC | PRN
  Administered 2013-07-29: 40 mg via INTRAVENOUS

## 2013-07-29 MED ORDER — ACETAMINOPHEN 650 MG RE SUPP: 650.0000 mg | Freq: Four times a day (QID) | RECTAL | Status: DC | PRN

## 2013-07-29 MED ORDER — MIDAZOLAM HCL 5 MG/5ML IJ SOLN
INTRAMUSCULAR | Status: DC | PRN
  Administered 2013-07-29: 2 mg via INTRAVENOUS

## 2013-07-29 MED ORDER — FENTANYL CITRATE 0.05 MG/ML IJ SOLN
INTRAMUSCULAR | Status: AC
  Filled 2013-07-29: qty 2

## 2013-07-29 MED ORDER — FENOFIBRATE 160 MG PO TABS
160.0000 mg | ORAL_TABLET | Freq: Every day | ORAL | Status: DC
  Administered 2013-07-30: 160 mg via ORAL
  Filled 2013-07-29: qty 1

## 2013-07-29 MED ORDER — OXYCODONE HCL ER 10 MG PO T12A
10.0000 mg | EXTENDED_RELEASE_TABLET | Freq: Two times a day (BID) | ORAL | Status: DC
  Administered 2013-07-29 – 2013-07-30 (×3): 10 mg via ORAL
  Filled 2013-07-29 (×3): qty 1

## 2013-07-29 MED ORDER — EPHEDRINE SULFATE 50 MG/ML IJ SOLN
INTRAMUSCULAR | Status: DC | PRN
  Administered 2013-07-29: 5 mg via INTRAVENOUS
  Administered 2013-07-29: 10 mg via INTRAVENOUS
  Administered 2013-07-29: 5 mg via INTRAVENOUS

## 2013-07-29 MED ORDER — DIPHENHYDRAMINE HCL 12.5 MG/5ML PO ELIX: 12.5000 mg | ORAL_SOLUTION | ORAL | Status: DC | PRN

## 2013-07-29 MED ORDER — ONDANSETRON HCL 4 MG/2ML IJ SOLN
INTRAMUSCULAR | Status: DC | PRN
  Administered 2013-07-29: 4 mg via INTRAVENOUS

## 2013-07-29 MED ORDER — POTASSIUM CHLORIDE CRYS ER 20 MEQ PO TBCR
20.0000 meq | EXTENDED_RELEASE_TABLET | Freq: Two times a day (BID) | ORAL | Status: DC
  Administered 2013-07-29 – 2013-07-30 (×2): 20 meq via ORAL
  Filled 2013-07-29 (×3): qty 1

## 2013-07-29 MED ORDER — PROPOFOL 10 MG/ML IV BOLUS
INTRAVENOUS | Status: DC | PRN
  Administered 2013-07-29: 150 mg via INTRAVENOUS

## 2013-07-29 MED ORDER — SIMVASTATIN 10 MG PO TABS
10.0000 mg | ORAL_TABLET | Freq: Every day | ORAL | Status: DC
  Administered 2013-07-29 – 2013-07-30 (×2): 10 mg via ORAL
  Filled 2013-07-29 (×2): qty 1

## 2013-07-29 MED ORDER — HYDROMORPHONE HCL PF 1 MG/ML IJ SOLN
0.2500 mg | INTRAMUSCULAR | Status: DC | PRN
  Administered 2013-07-29 (×2): 0.5 mg via INTRAVENOUS

## 2013-07-29 MED ORDER — LIDOCAINE HCL (CARDIAC) 20 MG/ML IV SOLN
INTRAVENOUS | Status: AC
  Filled 2013-07-29: qty 5

## 2013-07-29 MED ORDER — CHLORHEXIDINE GLUCONATE 4 % EX LIQD
60.0000 mL | Freq: Once | CUTANEOUS | Status: DC
  Filled 2013-07-29: qty 60

## 2013-07-29 MED ORDER — CEFAZOLIN SODIUM-DEXTROSE 2-3 GM-% IV SOLR
2.0000 g | Freq: Four times a day (QID) | INTRAVENOUS | Status: AC
  Administered 2013-07-29 (×2): 2 g via INTRAVENOUS
  Filled 2013-07-29 (×2): qty 50

## 2013-07-29 MED ORDER — OXYCODONE HCL 5 MG PO TABS
ORAL_TABLET | ORAL | Status: AC
  Filled 2013-07-29: qty 1

## 2013-07-29 MED ORDER — MIDAZOLAM HCL 2 MG/2ML IJ SOLN
INTRAMUSCULAR | Status: AC
  Administered 2013-07-29: 2 mg
  Filled 2013-07-29: qty 2

## 2013-07-29 MED ORDER — ZOLPIDEM TARTRATE 5 MG PO TABS: 5.0000 mg | ORAL_TABLET | Freq: Every evening | ORAL | Status: DC | PRN

## 2013-07-29 MED ORDER — BUPIVACAINE-EPINEPHRINE 0.5% -1:200000 IJ SOLN
INTRAMUSCULAR | Status: DC | PRN
  Administered 2013-07-29: 20 mL

## 2013-07-29 MED ORDER — LACTATED RINGERS IV SOLN
INTRAVENOUS | Status: DC
  Administered 2013-07-29 (×2): via INTRAVENOUS

## 2013-07-29 MED ORDER — BUPIVACAINE LIPOSOME 1.3 % IJ SUSP
20.0000 mL | Freq: Once | INTRAMUSCULAR | Status: DC
  Filled 2013-07-29: qty 20

## 2013-07-29 MED ORDER — FENTANYL CITRATE 0.05 MG/ML IJ SOLN
INTRAMUSCULAR | Status: AC
  Filled 2013-07-29: qty 5

## 2013-07-29 MED ORDER — INSULIN ASPART 100 UNIT/ML ~~LOC~~ SOLN
0.0000 [IU] | Freq: Three times a day (TID) | SUBCUTANEOUS | Status: DC
  Administered 2013-07-29: 3 [IU] via SUBCUTANEOUS
  Administered 2013-07-30: 8 [IU] via SUBCUTANEOUS
  Administered 2013-07-30: 3 [IU] via SUBCUTANEOUS
  Administered 2013-07-30: 5 [IU] via SUBCUTANEOUS

## 2013-07-29 MED ORDER — ENOXAPARIN SODIUM 30 MG/0.3ML ~~LOC~~ SOLN
30.0000 mg | Freq: Two times a day (BID) | SUBCUTANEOUS | Status: DC
  Administered 2013-07-30: 30 mg via SUBCUTANEOUS
  Filled 2013-07-29 (×3): qty 0.3

## 2013-07-29 MED ORDER — GLYBURIDE-METFORMIN 5-500 MG PO TABS: 2.0000 | ORAL_TABLET | Freq: Two times a day (BID) | ORAL | Status: DC

## 2013-07-29 MED ORDER — SODIUM CHLORIDE 0.9 % IR SOLN
Status: DC | PRN
  Administered 2013-07-29: 3000 mL
  Administered 2013-07-29: 1000 mL

## 2013-07-29 MED ORDER — MENTHOL 3 MG MT LOZG: 1.0000 | LOZENGE | OROMUCOSAL | Status: DC | PRN

## 2013-07-29 SURGICAL SUPPLY — 62 items
BANDAGE ELASTIC 6 VELCRO ST LF (GAUZE/BANDAGES/DRESSINGS) ×3 IMPLANT
BANDAGE ESMARK 6X9 LF (GAUZE/BANDAGES/DRESSINGS) ×1 IMPLANT
BLADE PATELLA W-PILOT HOLE 35 (BLADE) ×3 IMPLANT
BLADE SAGITTAL 13X1.27X60 (BLADE) ×2 IMPLANT
BLADE SAGITTAL 13X1.27X60MM (BLADE) ×1
BLADE SAW SGTL 83.5X18.5 (BLADE) ×3 IMPLANT
BNDG ESMARK 6X9 LF (GAUZE/BANDAGES/DRESSINGS) ×3
BOWL SMART MIX CTS (DISPOSABLE) ×3 IMPLANT
CAP POR NKTM CP VIT E LN CER ×3 IMPLANT
CEMENT BONE SIMPLEX SPEEDSET (Cement) ×6 IMPLANT
COVER SURGICAL LIGHT HANDLE (MISCELLANEOUS) ×3 IMPLANT
CUFF TOURNIQUET SINGLE 44IN (TOURNIQUET CUFF) ×3 IMPLANT
DRAPE EXTREMITY T 121X128X90 (DRAPE) ×3 IMPLANT
DRAPE INCISE IOBAN 66X45 STRL (DRAPES) ×6 IMPLANT
DRAPE PROXIMA HALF (DRAPES) ×3 IMPLANT
DRAPE U-SHAPE 47X51 STRL (DRAPES) ×3 IMPLANT
DRSG ADAPTIC 3X8 NADH LF (GAUZE/BANDAGES/DRESSINGS) ×3 IMPLANT
DRSG PAD ABDOMINAL 8X10 ST (GAUZE/BANDAGES/DRESSINGS) ×3 IMPLANT
DURAPREP 26ML APPLICATOR (WOUND CARE) ×6 IMPLANT
ELECT REM PT RETURN 9FT ADLT (ELECTROSURGICAL) ×3
ELECTRODE REM PT RTRN 9FT ADLT (ELECTROSURGICAL) ×1 IMPLANT
EVACUATOR 1/8 PVC DRAIN (DRAIN) ×3 IMPLANT
GLOVE BIOGEL M 7.0 STRL (GLOVE) ×9 IMPLANT
GLOVE BIOGEL PI IND STRL 7.5 (GLOVE) ×2 IMPLANT
GLOVE BIOGEL PI INDICATOR 7.5 (GLOVE) ×4
GLOVE SURG ORTHO 8.0 STRL STRW (GLOVE) ×6 IMPLANT
GLOVE SURG SS PI 6.5 STRL IVOR (GLOVE) ×12 IMPLANT
GOWN STRL REUS W/ TWL LRG LVL3 (GOWN DISPOSABLE) ×2 IMPLANT
GOWN STRL REUS W/ TWL XL LVL3 (GOWN DISPOSABLE) ×2 IMPLANT
GOWN STRL REUS W/TWL LRG LVL3 (GOWN DISPOSABLE) ×4
GOWN STRL REUS W/TWL XL LVL3 (GOWN DISPOSABLE) ×4
HANDPIECE INTERPULSE COAX TIP (DISPOSABLE) ×2
HOOD PEEL AWAY FACE SHEILD DIS (HOOD) ×9 IMPLANT
IMMOBILIZER KNEE 24 THIGH 36 (MISCELLANEOUS) ×1 IMPLANT
IMMOBILIZER KNEE 24 UNIV (MISCELLANEOUS) ×3
KIT BASIN OR (CUSTOM PROCEDURE TRAY) ×3 IMPLANT
KIT ROOM TURNOVER OR (KITS) ×3 IMPLANT
MANIFOLD NEPTUNE II (INSTRUMENTS) ×3 IMPLANT
NEEDLE 22X1 1/2 (OR ONLY) (NEEDLE) ×6 IMPLANT
NS IRRIG 1000ML POUR BTL (IV SOLUTION) ×3 IMPLANT
PACK TOTAL JOINT (CUSTOM PROCEDURE TRAY) ×3 IMPLANT
PAD ABD 8X10 STRL (GAUZE/BANDAGES/DRESSINGS) ×3 IMPLANT
PAD ARMBOARD 7.5X6 YLW CONV (MISCELLANEOUS) ×6 IMPLANT
PADDING CAST ABS 6INX4YD NS (CAST SUPPLIES) ×2
PADDING CAST ABS COTTON 6X4 NS (CAST SUPPLIES) ×1 IMPLANT
PADDING CAST COTTON 6X4 STRL (CAST SUPPLIES) ×3 IMPLANT
SET HNDPC FAN SPRY TIP SCT (DISPOSABLE) ×1 IMPLANT
SPONGE GAUZE 4X4 12PLY (GAUZE/BANDAGES/DRESSINGS) ×3 IMPLANT
SPONGE GAUZE 4X4 12PLY STER LF (GAUZE/BANDAGES/DRESSINGS) ×3 IMPLANT
STAPLER VISISTAT 35W (STAPLE) ×3 IMPLANT
SUCTION FRAZIER TIP 10 FR DISP (SUCTIONS) ×3 IMPLANT
SUT BONE WAX W31G (SUTURE) ×6 IMPLANT
SUT VIC AB 0 CTB1 27 (SUTURE) ×6 IMPLANT
SUT VIC AB 1 CT1 27 (SUTURE) ×8
SUT VIC AB 1 CT1 27XBRD ANBCTR (SUTURE) ×4 IMPLANT
SUT VIC AB 2-0 CT1 27 (SUTURE) ×4
SUT VIC AB 2-0 CT1 TAPERPNT 27 (SUTURE) ×2 IMPLANT
SYR 20CC LL (SYRINGE) ×6 IMPLANT
TOWEL OR 17X24 6PK STRL BLUE (TOWEL DISPOSABLE) ×3 IMPLANT
TOWEL OR 17X26 10 PK STRL BLUE (TOWEL DISPOSABLE) ×3 IMPLANT
TRAY FOLEY CATH 14FR (SET/KITS/TRAYS/PACK) ×3 IMPLANT
WATER STERILE IRR 1000ML POUR (IV SOLUTION) ×6 IMPLANT

## 2013-07-29 NOTE — Evaluation (Signed)
Physical Therapy Evaluation Patient Details Name: Brandon KettleRobert F Villarreal MRN: [id number] DOB: 11/12/1950 Today's Date: 07/29/2013   History of Present Illness  Pt admitted for elective R TKA. Pt with h/o of L TKA 12 yrs ago and obesity.  Clinical Impression  Pt is s/p TKA resulting in the deficits listed below (see PT Problem List). Pt "whoozy" upon sitting, requiring increased assist for sit to stand transfer. Pt beginning to initiate quad set. Per pt's spouse, pt to wear R KI when up amb for 2 weeks due to R knee valgus and stretched medial ligament.  Pt will benefit from skilled PT to increase their independence and safety with mobility to allow discharge to the venue listed below.      Follow Up Recommendations Home health PT;Supervision/Assistance - 24 hour    Equipment Recommendations  None recommended by PT (equip already delivered)    Recommendations for Other Services       Precautions / Restrictions Precautions Precautions: Knee;Fall Required Braces or Orthoses: Knee Immobilizer - Right Knee Immobilizer - Right: On when out of bed or walking Restrictions Weight Bearing Restrictions: Yes RLE Weight Bearing: Weight bearing as tolerated      Mobility  Bed Mobility Overal bed mobility: Needs Assistance Bed Mobility: Supine to Sit     Supine to sit: Mod assist     General bed mobility comments: assist for R LE and trunk elevation, v/c's for long sit technique  Transfers Overall transfer level: Needs assistance Equipment used: Rolling walker (2 wheeled) Transfers: Sit to/from [employer]GI CorporationStand;Stand Pivot Transfers Sit to Stand: Mod assist;From elevated surface Stand pivot transfers: Min assist       General transfer comment: max directional v/c's, pt with report of "I"m so dizzy headed" while sitting EOB. assist to initiate transfer and anterior weight shift  Ambulation/Gait             General Gait Details: completed stand pivot transfer with 5 steps. max direcitonal  v/c's, minA, v/c's for sequencing  Stairs            Wheelchair Mobility    Modified Rankin (Stroke Patients Only)       Balance                                             Pertinent Vitals/Pain 8/10 R knee pain    Home Living Family/patient expects to be discharged to:: Private residence Living Arrangements: Spouse/significant other Available Help at Discharge: Family;Available 24 hours/day Type of Home: House Home Access: Stairs to enter Entrance Stairs-Rails: Right Entrance Stairs-Number of Steps: 2 Home Layout: One level Home Equipment: Walker - 2 wheels;Shower seat (CPM - all already delivered)      Prior Function Level of Independence: Independent               Hand Dominance   Dominant Hand: Right    Extremity/Trunk Assessment   Upper Extremity Assessment: Overall WFL for tasks assessed           Lower Extremity Assessment: RLE deficits/detail RLE Deficits / Details: minimal quad set, ankle pumps    Cervical / Trunk Assessment: Normal  Communication   Communication: No difficulties  Cognition Arousal/Alertness: Awake/alert Behavior During Therapy: WFL for tasks assessed/performed Overall Cognitive Status: Within Functional Limits for tasks assessed  General Comments      Exercises Total Joint Exercises Ankle Circles/Pumps: AROM;Both;10 reps;Supine Quad Sets: AROM;Right;20 reps;Supine (with 5 second hold) Heel Slides: AROM;AAROM;Right;10 reps;Supine Goniometric ROM: R knee AROM flex,: 20 deg, PROM 45 deg      Assessment/Plan    PT Assessment Patient needs continued PT services  PT Diagnosis Difficulty walking;Acute pain   PT Problem List Decreased strength;Decreased activity tolerance;Decreased range of motion;Decreased mobility;Obesity  PT Treatment Interventions DME instruction;Gait training;Stair training;Functional mobility training;Therapeutic activities;Therapeutic  exercise   PT Goals (Current goals can be found in the Care Plan section) Acute Rehab PT Goals Patient Stated Goal: improved pain PT Goal Formulation: With patient Time For Goal Achievement: 08/05/13 Potential to Achieve Goals: Good    Frequency 7X/week   Barriers to discharge        Co-evaluation               End of Session Equipment Utilized During Treatment: Gait belt Activity Tolerance: Patient tolerated treatment well Patient left: in chair;with call bell/phone within reach Nurse Communication: Mobility status         Time: [id number] PT Time Calculation (min): 39 min   Charges:   PT Evaluation $Initial PT Evaluation Tier I: 1 Procedure PT Treatments $Gait Training: 8-22 mins $Therapeutic Exercise: 8-22 mins   PT G Codes:          Marcene BrawnChadwell, Ashly Marie 07/29/2013, 4:33 PM  Lewis ShockAshly Chadwell, PT, DPT Pager #: [phone] Office #: [phone]

## 2013-07-29 NOTE — Progress Notes (Signed)
Orthopedic Tech Progress Note Patient Details:  Brandon KettleRobert F Villarreal 06/13/1950 [id number]  Patient ID: Brandon Villarreal, male   DOB: 03/18/1950, 63 y.o.   MRN: [id number] Placed pt's rle in cpm @ 0-90 degrees @2000   Nikki DomCrawford, Rembert 07/29/2013, 8:00 PM

## 2013-07-29 NOTE — Care Management Utilization Note (Signed)
Utilization review completed. Maronda Caison, RN BSN CM 

## 2013-07-29 NOTE — Progress Notes (Signed)
Orthopedic Tech Progress Note Patient Details:  Brandon KettleRobert F Ogata 08/28/1950 [id number]  CPM Right Knee CPM Right Knee: On Right Knee Flexion (Degrees): 90 Right Knee Extension (Degrees): 0 Additional Comments: CPM applied to the right lower extremity ROM set 0^-90^  Ortho Devices Type of Ortho Device: Other (comment) Ortho Device/Splint Location: footsie roll left with patient for future use.   Early CharsBaker,William Anthony 07/29/2013, 1:20 PM

## 2013-07-29 NOTE — Anesthesia Preprocedure Evaluation (Addendum)
Anesthesia Evaluation  Patient identified by MRN, date of birth, ID band Patient awake    Reviewed: Allergy & Precautions, H&P , NPO status , Patient's Chart, lab work & pertinent test results  Airway Mallampati: II TM Distance: >3 FB Neck ROM: Full    Dental no notable dental hx. (+) Partial Upper, Partial Lower, Dental Advisory Given   Pulmonary neg pulmonary ROS,  breath sounds clear to auscultation  Pulmonary exam normal       Cardiovascular hypertension, On Medications Rhythm:Regular Rate:Normal     Neuro/Psych negative neurological ROS  negative psych ROS   GI/Hepatic negative GI ROS, Neg liver ROS,   Endo/Other  diabetes, Insulin Dependent, Oral Hypoglycemic AgentsMorbid obesity  Renal/GU negative Renal ROS  negative genitourinary   Musculoskeletal   Abdominal   Peds  Hematology negative hematology ROS (+)   Anesthesia Other Findings   Reproductive/Obstetrics negative OB ROS                          Anesthesia Physical Anesthesia Plan  ASA: III  Anesthesia Plan: General and Regional   Post-op Pain Management:    Induction: Intravenous  Airway Management Planned: LMA  Additional Equipment:   Intra-op Plan:   Post-operative Plan: Extubation in OR  Informed Consent: I have reviewed the patients History and Physical, chart, labs and discussed the procedure including the risks, benefits and alternatives for the proposed anesthesia with the patient or authorized representative who has indicated his/her understanding and acceptance.   Dental advisory given  Plan Discussed with: CRNA  Anesthesia Plan Comments:         Anesthesia Quick Evaluation

## 2013-07-29 NOTE — Transfer of Care (Signed)
Immediate Anesthesia Transfer of Care Note  Patient: Brandon KettleRobert F Villarreal  Procedure(s) Performed: Procedure(s): TOTAL KNEE ARTHROPLASTY (Right)  Patient Location: PACU  Anesthesia Type:GA combined with regional for post-op pain  Level of Consciousness: awake and alert   Airway & Oxygen Therapy: Patient Spontanous Breathing and Patient connected to nasal cannula oxygen  Post-op Assessment: Report given to PACU RN and Post -op Vital signs reviewed and stable  Post vital signs: Reviewed and stable  Complications: No apparent anesthesia complications

## 2013-07-29 NOTE — Op Note (Signed)
TOTAL KNEE REPLACEMENT OPERATIVE NOTE:  07/29/2013  10:02 PM  PATIENT:  Brandon Villarreal  63 y.o. male  PRE-OPERATIVE DIAGNOSIS:  osteoarthritis right knee  POST-OPERATIVE DIAGNOSIS:  osteoarthritis right knee  PROCEDURE:  Procedure(s): TOTAL KNEE ARTHROPLASTY  SURGEON:  Surgeon(s): Dannielle HuhSteve Lucey, MD  PHYSICIAN ASSISTANT: [name], [facility]  ANESTHESIA:   general  DRAINS: Hemovac  SPECIMEN: None  COUNTS:  Correct  TOURNIQUET:   Total Tourniquet Time Documented: Thigh (Right) - 90 minutes Total: Thigh (Right) - 90 minutes   DICTATION:  Indication for procedure:    The patient is a 63 y.o. male who has failed conservative treatment for osteoarthritis right knee.  Informed consent was obtained prior to anesthesia. The risks versus benefits of the operation were explain and in a way the patient can, and did, understand.   On the implant demand matching protocol, this patient scored 15.  Therefore, this patient was receive a polyethylene insert with vitamin E which is a high demand implant.  Description of procedure:     The patient was taken to the operating room and placed under anesthesia.  The patient was positioned in the usual fashion taking care that all body parts were adequately padded and/or protected.  I foley catheter was not placed.  A tourniquet was applied and the leg prepped and draped in the usual sterile fashion.  The extremity was exsanguinated with the esmarch and tourniquet inflated to 350 mmHg.  Pre-operative range of motion was 5-110 degrees flexion.  The knee was in 10 degree of significant valgus.  A midline incision approximately 6-7 inches long was made with a #10 blade.  A new blade was used to make a parapatellar arthrotomy going 2-3 cm into the quadriceps tendon, over the patella, and alongside the medial aspect of the patellar tendon.  A synovectomy was then performed with the #10 blade and forceps. I then elevated the deep MCL off the medial tibial  metaphysis subperiosteally around to the semimembranosus attachment.    I everted the patella and used calipers to measure patellar thickness.  I used the reamer to ream down to appropriate thickness to recreate the native thickness.  I then removed excess bone with the rongeur and sagittal saw.  I used the appropriately sized template and drilled the three lug holes.  I then put the trial in place and measured the thickness with the calipers to ensure recreation of the native thickness.  The trial was then removed and the patella subluxed and the knee brought into flexion.  A homan retractor was place to retract and protect the patella and lateral structures.  A Z-retractor was place medially to protect the medial structures.  The extra-medullary alignment system was used to make cut the tibial articular surface perpendicular to the anamotic axis of the tibia and in 3 degrees of posterior slope.  The cut surface and alignment jig was removed.  I then used the intramedullary alignment guide to make a 4 valgus cut on the distal femur.  I then marked out the epicondylar axis on the distal femur.  The posterior condylar axis measured 3 degrees.  I then used the anterior referencing sizer and measured the femur to be a size 11.  The 4-In-1 cutting block was screwed into place in external rotation matching the posterior condylar angle, making our cuts perpendicular to the epicondylar axis.  Anterior, posterior and chamfer cuts were made with the sagittal saw.  The cutting block and cut pieces were removed.  A lamina spreader was placed in 90 degrees of flexion.  The ACL, PCL, menisci, and posterior condylar osteophytes were removed.  A 20 mm spacer blocked was found to offer good flexion and extension gap balance after severe in degree releasing.   The scoop retractor was then placed and the femoral finishing block was pinned in place.  The small sagittal saw was used as well as the lug drill to finish the femur.   The block and cut surfaces were removed and the medullary canal hole filled with autograft bone from the cut pieces.  The tibia was delivered forward in deep flexion and external rotation.  A size G tray was selected and pinned into place centered on the medial 1/3 of the tibial tubercle.  The reamer and keel was used to prepare the tibia through the tray.    I then trialed with the size 11 femur, size G tibia, a 20 mm insert and the 41 patella.  I had excellent flexion/extension gap balance, excellent patella tracking.  Flexion was full and beyond 120 degrees; extension was zero.  These components were chosen and the staff opened them to me on the back table while the knee was lavaged copiously and the cement mixed.  The soft tissue was infiltrated with 60cc of exparel 1.3% through a 21 gauge needle.  I cemented in the components and removed all excess cement.  The polyethylene tibial component was snapped into place and the knee placed in extension while cement was hardening.  The capsule was infilltrated with 30cc of .25% Marcaine with epinephrine.  A hemovac was place in the joint exiting superolaterally.  A pain pump was place superomedially superficial to the arthrotomy.  Once the cement was hard, the tourniquet was let down.  Hemostasis was obtained.  The arthrotomy was closed with figure-8 #1 vicryl sutures.  The deep soft tissues were closed with #0 vicryls and the subcuticular layer closed with a running #2-0 vicryl.  The skin was reapproximated and closed with skin staples.  The wound was dressed with xeroform, 4 x4's, 2 ABD sponges, a single layer of webril and a TED stocking.   The patient was then awakened, extubated, and taken to the recovery room in stable condition.  BLOOD LOSS:  300cc DRAINS: 1 hemovac, 1 pain catheter COMPLICATIONS:  None.  PLAN OF CARE: Admit to inpatient   PATIENT DISPOSITION:  PACU - hemodynamically stable.   Delay start of Pharmacological VTE agent (>24hrs)  due to surgical blood loss or risk of bleeding:  not applicable  Please fax a copy of this op note to my office at [phone] (please only include page 1 and 2 of the Case Information op note)

## 2013-07-29 NOTE — Anesthesia Postprocedure Evaluation (Signed)
Anesthesia Post Note  Patient: Brandon KettleRobert F Villarreal  Procedure(s) Performed: Procedure(s) (LRB): TOTAL KNEE ARTHROPLASTY (Right)  Anesthesia type: general  Patient location: PACU  Post pain: Pain level controlled  Post assessment: Patient's Cardiovascular Status Stable  Last Vitals:  Filed Vitals:   07/29/13 1345  BP: 115/59  Pulse: 91  Temp: 36.6 C  Resp: 14    Post vital signs: Reviewed and stable  Level of consciousness: sedated  Complications: No apparent anesthesia complications

## 2013-07-29 NOTE — Progress Notes (Signed)
Bruises and scabs noted to right lower leg.  Patients wife states that he bumps his leg at home at times.

## 2013-07-29 NOTE — H&P (Signed)
Brandon KettleRobert F Villarreal MRN:  [id number] DOB/SEX:  01/01/1951/male  CHIEF COMPLAINT:  Painful right Knee  HISTORY: Patient is a 63 y.o. male presented with a history of pain in the right knee. Onset of symptoms was gradual starting several years ago with gradually worsening course since that time. Prior procedures on the knee include none. Patient has been treated conservatively with over-the-counter NSAIDs and activity modification. Patient currently rates pain in the knee at 10 out of 10 with activity. There is pain at night.  PAST MEDICAL HISTORY: Patient Active Problem List   Diagnosis Date Noted  . Essential hypertension, benign 06/28/2012  . Type II or unspecified type diabetes mellitus with neurological manifestations, not stated as uncontrolled(250.60) 06/28/2012  . Other and unspecified hyperlipidemia 06/28/2012  . Erectile dysfunction 06/28/2012  . Venous stasis 06/28/2012  . 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 Sensory neuropathy 06/28/2012   Past Medical History  Diagnosis Date  . Hypertension   . Hyperlipidemia   . ED (erectile dysfunction)   . Obesity   . Sensory neuropathy   . Microproteinuria   . Venous stasis   . Diabetes mellitus     fasting 90-120  . Arthritis    Past Surgical History  Procedure Laterality Date  . Replacement total knee      left  . Back surgery      lumbar disc  . Hammer toe surgery  02/10/2011    Procedure: HAMMER TOE CORRECTION;  Surgeon: Dallas SchimkeBenjamin Ivan McKinney;  Location: [facility];  Service: Orthopedics;  Laterality: Right;  Percutaneous Flexor Tenotomy third Toe Right Foot  . Joint replacement Left     knee  . Hernia repair      umbilical     MEDICATIONS:   Prescriptions prior to admission  Medication Sig Dispense Refill  . aspirin 81 MG tablet Take 81 mg by mouth daily.      Marland Kitchen. doxazosin (CARDURA) 2 MG tablet Take 1 tablet (2 mg total) by mouth daily.  90 tablet  1  . ibuprofen  (ADVIL,MOTRIN) 200 MG tablet Take 400 mg by mouth every 6 (six) hours as needed for moderate pain.      Marland Kitchen. amLODipine (NORVASC) 10 MG tablet Take 0.5 tablets (5 mg total) by mouth 2 (two) times daily.  90 tablet  1  . benazepril (LOTENSIN) 40 MG tablet Take 1 tablet (40 mg total) by mouth daily.  90 tablet  1  . Canagliflozin (INVOKANA) 100 MG TABS Take 1 tablet (100 mg total) by mouth every morning.  90 tablet  1  . fenofibrate 160 MG tablet Take 1 tablet (160 mg total) by mouth daily.  90 tablet  1  . furosemide (LASIX) 40 MG tablet Take 1 tablet (40 mg total) by mouth daily.  90 tablet  1  . glyBURIDE-metformin (GLUCOVANCE) 5-500 MG per tablet Take 2 tablets by mouth 2 (two) times daily with a meal.  360 tablet  1  . insulin glargine (LANTUS) 100 UNIT/ML injection Inject 0.98 mLs (98 Units total) into the skin at bedtime.  10 mL  1  . potassium chloride SA (K-DUR,KLOR-CON) 20 MEQ tablet Take 1 tablet (20 mEq total) by mouth 2 (two) times daily.  180 tablet  1  . pravastatin (PRAVACHOL) 20 MG tablet Take 1 tablet (20 mg total) by mouth every evening.  90 tablet  1    ALLERGIES:  No Known Allergies  REVIEW OF SYSTEMS:  Pertinent items are noted  in HPI.   FAMILY HISTORY:   Family History  Problem Relation Age of Onset  . Anesthesia problems Neg Hx   . Hypotension Neg Hx   . Malignant hyperthermia Neg Hx   . Pseudochol deficiency Neg Hx     SOCIAL HISTORY:   History  Substance Use Topics  . Smoking status: Never Smoker   . Smokeless tobacco: Not on file  . Alcohol Use: No     EXAMINATION:  Vital signs in last 24 hours:    General appearance: alert, cooperative and no distress Lungs: clear to auscultation bilaterally Heart: regular rate and rhythm, S1, S2 normal, no murmur, click, rub or gallop Abdomen: soft, non-tender; bowel sounds normal; no masses,  no organomegaly Extremities: extremities normal, atraumatic, no cyanosis or edema and Homans sign is negative, no sign of  DVT Pulses: 2+ and symmetric Skin: Skin color, texture, turgor normal. No rashes or lesions Neurologic: Alert and oriented X 3, normal strength and tone. Normal symmetric reflexes. Normal coordination and gait  Musculoskeletal:  ROM 0-110, Ligaments intact,  Imaging Review Plain radiographs demonstrate severe degenerative joint disease of the right knee. The overall alignment is mild valgus. The bone quality appears to be good for age and reported activity level.  Assessment/Plan: End stage arthritis, right knee   The patient history, physical examination and imaging studies are consistent with advanced degenerative joint disease of the right knee. The patient has failed conservative treatment.  The clearance notes were reviewed.  After discussion with the patient it was felt that Total Knee Replacement was indicated. The procedure,  risks, and benefits of total knee arthroplasty were presented and reviewed. The risks including but not limited to aseptic loosening, infection, blood clots, vascular injury, stiffness, patella tracking problems complications among others were discussed. The patient acknowledged the explanation, agreed to proceed with the plan.  Brandon Villarreal 07/29/2013, 6:47 AM

## 2013-07-29 NOTE — Anesthesia Procedure Notes (Addendum)
Anesthesia Regional Block:  Femoral nerve block  Pre-Anesthetic Checklist: ,, timeout performed, Correct Patient, Correct Site, Correct Laterality, Correct Procedure, Correct Position, site marked, Risks and benefits discussed, pre-op evaluation,  At surgeon's request and post-op pain management  Laterality: Right  Prep: Maximum Sterile Barrier Precautions used and chloraprep       Needles:  Injection technique: Single-shot  Needle Type: Echogenic Stimulator Needle     Needle Length: 5cm 5 cm Needle Gauge: 22 and 22 G    Additional Needles:  Procedures: ultrasound guided (picture in chart) and nerve stimulator Femoral nerve block  Nerve Stimulator or Paresthesia:  Response: Patellar respose,   Additional Responses:   Narrative:  Start time: 07/29/2013 7:40 AM End time: 07/29/2013 7:50 AM Injection made incrementally with aspirations every 5 mL. Anesthesiologist: Fitzgerald,MD  Additional Notes: 2% Lidocaine skin wheel.    Procedure Name: LMA Insertion Date/Time: 07/29/2013 8:40 AM Performed by: Margaree MackintoshYACOUB, Monisha Siebel B Pre-anesthesia Checklist: Emergency Drugs available, Suction available, Patient being monitored, Patient identified and Timeout performed Patient Re-evaluated:Patient Re-evaluated prior to inductionOxygen Delivery Method: Circle system utilized Preoxygenation: Pre-oxygenation with 100% oxygen Intubation Type: IV induction LMA: LMA inserted LMA Size: 5.0 Number of attempts: 1 Placement Confirmation: positive ETCO2 and breath sounds checked- equal and bilateral Tube secured with: Tape Dental Injury: Teeth and Oropharynx as per pre-operative assessment

## 2013-07-30 ENCOUNTER — Encounter (HOSPITAL_COMMUNITY): Payer: Self-pay | Admitting: Orthopedic Surgery

## 2013-07-30 LAB — BASIC METABOLIC PANEL
BUN: 24 mg/dL — ABNORMAL HIGH (ref 6–23)
CHLORIDE: 102 meq/L (ref 96–112)
CO2: 26 mEq/L (ref 19–32)
CREATININE: 0.92 mg/dL (ref 0.50–1.35)
Calcium: 8.1 mg/dL — ABNORMAL LOW (ref 8.4–10.5)
GFR calc Af Amer: >90 mL/min (ref >90)
GFR calc non Af Amer: 89 mL/min — ABNORMAL LOW (ref >90)
Glucose, Bld: 201 mg/dL — ABNORMAL HIGH (ref 70–99)
Potassium: 5.1 mEq/L (ref 3.7–5.3)
SODIUM: 140 meq/L (ref 137–147)

## 2013-07-30 LAB — CBC
HEMATOCRIT: 33.8 % — AB (ref 39.0–52.0)
Hemoglobin: 11.3 g/dL — ABNORMAL LOW (ref 13.0–17.0)
MCH: 28.3 pg (ref 26.0–34.0)
MCHC: 33.4 g/dL (ref 30.0–36.0)
MCV: 84.7 fL (ref 78.0–100.0)
Platelets: 145 10*3/uL — ABNORMAL LOW (ref 150–400)
RBC: 3.99 MIL/uL — AB (ref 4.22–5.81)
RDW: 14.9 % (ref 11.5–15.5)
WBC: 9.7 10*3/uL (ref 4.0–10.5)

## 2013-07-30 LAB — GLUCOSE, CAPILLARY
GLUCOSE-CAPILLARY: 194 mg/dL — AB (ref 70–99)
GLUCOSE-CAPILLARY: 214 mg/dL — AB (ref 70–99)
Glucose-Capillary: 270 mg/dL — ABNORMAL HIGH (ref 70–99)

## 2013-07-30 MED ORDER — METHOCARBAMOL 500 MG PO TABS: 500.0000 mg | ORAL_TABLET | Freq: Four times a day (QID) | ORAL | Status: DC | PRN

## 2013-07-30 MED ORDER — OXYCODONE HCL 5 MG PO TABS: 5.0000 mg | ORAL_TABLET | ORAL | Status: DC | PRN

## 2013-07-30 MED ORDER — ENOXAPARIN SODIUM 40 MG/0.4ML ~~LOC~~ SOLN: 40.0000 mg | SUBCUTANEOUS | Status: DC

## 2013-07-30 MED ORDER — OXYCODONE HCL ER 10 MG PO T12A: 10.0000 mg | EXTENDED_RELEASE_TABLET | Freq: Two times a day (BID) | ORAL | Status: DC

## 2013-07-30 NOTE — Discharge Instructions (Signed)
Diet: As you were doing prior to hospitalization  ° °Activity:  Increase activity slowly as tolerated  °                No lifting or driving for 6 weeks ° °Shower:  May shower without a dressing once there is no drainage from your wound. °                Do NOT wash over the wound. °                °Dressing:  You may change your dressing on Wednesday °                   Then change the dressing daily with sterile 4"x4"s gauze dressing  °                   And TED hose for knees. ° °Weight Bearing:  Weight bearing as tolerated as taught in physical therapy.  Use a                                walker or Crutches as instructed. ° °To prevent constipation: you may use a stool softener such as - °              Colace ( over the counter) 100 mg by mouth twice a day  °              Drink plenty of fluids ( prune juice may be helpful) and high fiber foods °               Miralax ( over the counter) for constipation as needed.   ° °Precautions:  If you experience chest pain or shortness of breath - call 911 immediately               For transfer to the hospital emergency department!! °              If you develop a fever greater that 101 F, purulent drainage from wound,                             increased redness or drainage from wound, or calf pain -- Call the office. ° °Follow- Up Appointment:  Please call for an appointment to be seen on 08/13/13 °                                             [facility] office:  [phone] °           [address] [place], [place] [address] °               ° ° °

## 2013-07-30 NOTE — Progress Notes (Signed)
Physical Therapy Treatment Patient Details Name: Brandon KettleRobert F Obara MRN: [id number] DOB: 11/09/1950 Today's Date: 07/30/2013    History of Present Illness Pt admitted for elective R TKA. Pt with h/o of L TKA 12 yrs ago and obesity.    PT Comments    Patient requiring +2 assist to stand from low surface of recliner. Patient and wife stated that he had a chair with higher arm rest at home. Patient and wife both hoping that patient will be doing well enough this afternoon to be able to discharge. Patient will need to be able to ascend two steps and will practice this afternoon. Patient will also need to ensure that he and wife are safe to stand patient up from lower surface prior to returning home today. Will follow up with this patient early afternoon to determine discharge safety  Follow Up Recommendations  Home health PT;Supervision/Assistance - 24 hour     Equipment Recommendations  None recommended by PT    Recommendations for Other Services       Precautions / Restrictions Precautions Precautions: Knee;Fall Precaution Comments: reviewed Required Braces or Orthoses: Knee Immobilizer - Right Knee Immobilizer - Right: On when out of bed or walking Restrictions Weight Bearing Restrictions: Yes RLE Weight Bearing: Weight bearing as tolerated    Mobility  Bed Mobility               General bed mobility comments: Patient in recliner before and after session  Transfers Overall transfer level: Needs assistance Equipment used: Rolling walker (2 wheeled) Transfers: Sit to/from Stand Sit to Stand: +2 physical assistance;Mod assist         General transfer comment: Pt needing +2 physical assistance during powering up from recliner level surface. Mod A given to right side, min A for balance given on left side. Cues given for technique  Ambulation/Gait Ambulation/Gait assistance: Min guard Ambulation Distance (Feet): 80 Feet Assistive device: Rolling walker (2 wheeled) Gait  Pattern/deviations: Step-to pattern;Decreased step length - right;Decreased step length - left     General Gait Details: Cues for gait seqence and posture. Required several standing rest breaks   Stairs            Wheelchair Mobility    Modified Rankin (Stroke Patients Only)       Balance Overall balance assessment: Needs assistance Sitting-balance support: Bilateral upper extremity supported;Feet supported Sitting balance-Leahy Scale: Fair     Standing balance support: Bilateral upper extremity supported;During functional activity Standing balance-Leahy Scale: Poor                      Cognition Arousal/Alertness: Awake/alert Behavior During Therapy: WFL for tasks assessed/performed Overall Cognitive Status: Within Functional Limits for tasks assessed                      Exercises Total Joint Exercises Ankle Circles/Pumps: AROM;Both;10 reps;Supine Quad Sets: Right;10 reps;AROM Heel Slides: AAROM;Right;10 reps Hip ABduction/ADduction: AAROM;Right;10 reps Straight Leg Raises: AAROM;Right;10 reps Long Arc Quad: AAROM;Right;10 reps    General Comments General comments (skin integrity, edema, etc.): pt c/o mild dizziness during session      Pertinent Vitals/Pain no apparent distress     Home Living Family/patient expects to be discharged to:: Private residence Living Arrangements: Spouse/significant other Available Help at Discharge: Family;Available 24 hours/day Type of Home: House Home Access: Stairs to enter Entrance Stairs-Rails: Right Home Layout: One level Home Equipment: Walker - 2 wheels;Shower seat;Adaptive equipment  Prior Function Level of Independence: Independent          PT Goals (current goals can now be found in the care plan section) Acute Rehab PT Goals Patient Stated Goal: not stated Progress towards PT goals: Progressing toward goals    Frequency  7X/week    PT Plan Current plan remains appropriate     Co-evaluation             End of Session Equipment Utilized During Treatment: Gait belt Activity Tolerance: Patient tolerated treatment well Patient left: in chair;with call bell/phone within reach     Time: 0850-0931 PT Time Calculation (min): 41 min  Charges:  $Gait Training: 8-22 mins $Therapeutic Exercise: 8-22 mins $Therapeutic Activity: 8-22 mins                    G Codes:      Fredrich BirksRobinette, Julia Elizabeth 07/30/2013, 10:40 AM 07/30/2013 Fredrich Birksobinette, Julia Elizabeth PTA [phone] pager [phone] office

## 2013-07-30 NOTE — Plan of Care (Signed)
Problem: Consults Goal: Diagnosis- Total Joint Replacement Primary Total Knee     

## 2013-07-30 NOTE — Progress Notes (Signed)
D/C instructions and scripts given to Pt. Pt verbalized understanding of home care and how to admin Lovenox injections. Pts family at the bedside to take Pt home at this time.

## 2013-07-30 NOTE — Progress Notes (Signed)
SPORTS MEDICINE AND JOINT REPLACEMENT  Brandon SpurlingStephen Lucey, MD   Brandon CabalMaurice Jones, PA-C [address], [place], [place]  [id number]                             [phone]   PROGRESS NOTE  Subjective:  negative for Chest Pain  negative for Shortness of Breath  negative for Nausea/Vomiting   negative for Calf Pain  negative for Bowel Movement   Tolerating Diet: yes         Patient reports pain as 6 on 0-10 scale.    Objective: Vital signs in last 24 hours:   Patient Vitals for the past 24 hrs:  BP Temp Temp src Pulse Resp SpO2  07/30/13 1100 124/65 mmHg - - 72 - -  07/30/13 0759 - - - - 18 -  07/30/13 0439 112/49 mmHg 100 F (37.8 C) Oral 91 16 97 %  07/30/13 0400 - - - - 14 98 %  07/30/13 0044 115/59 mmHg 98.9 F (37.2 C) Oral 97 16 97 %  07/30/13 0000 - - - - 14 98 %  07/29/13 2115 131/68 mmHg 98.4 F (36.9 C) Oral 96 16 93 %  07/29/13 2000 - - - - 17 96 %  07/29/13 1431 130/73 mmHg 98.1 F (36.7 C) Oral 88 17 98 %  07/29/13 1345 115/59 mmHg 97.8 F (36.6 C) - 91 14 95 %  07/29/13 1342 - - - 91 19 92 %  07/29/13 1339 - - - 89 15 98 %  07/29/13 1330 105/58 mmHg - - 92 22 92 %  07/29/13 1315 125/64 mmHg 97.7 F (36.5 C) - 88 28 94 %  07/29/13 1300 117/60 mmHg - - 87 19 99 %  07/29/13 1255 - - - 90 21 93 %  07/29/13 1245 124/64 mmHg - - 88 18 96 %  07/29/13 1230 134/61 mmHg - - 87 10 97 %  07/29/13 1215 129/66 mmHg - - 85 21 94 %  07/29/13 1200 118/64 mmHg - - 89 17 97 %    @flow {1959:LAST@   Intake/Output from previous day:   06/29 0701 - 06/30 0700 In: 3610 [P.O.:440; I.V.:2920] Out: 3375 [Urine:1600; Drains:1425]   Intake/Output this shift:   06/30 0701 - 06/30 1900 In: -  Out: 150 [Drains:150]   Intake/Output     06/29 0701 - 06/30 0700 06/30 0701 - 07/01 0700   P.O. 440    I.V. (mL/kg) 2920 (19.1)    IV Piggyback 250    Total Intake(mL/kg) 3610 (23.6)    Urine (mL/kg/hr) 1600 (0.4)    Drains 1425 (0.4) 150 (0.2)   Blood 350 (0.1)    Total Output  3375 150   Net +235 -150           LABORATORY DATA:  Recent Labs  07/29/13 1528 07/30/13 0650  WBC 12.5* 9.7  HGB 12.5* 11.3*  HCT 37.4* 33.8*  PLT 170 145*    Recent Labs  07/29/13 1528 07/30/13 0650  NA  --  140  K  --  5.1  CL  --  102  CO2  --  26  BUN  --  24*  CREATININE 0.71 0.92  GLUCOSE  --  201*  CALCIUM  --  8.1*   Lab Results  Component Value Date   INR 0.99 07/19/2013    Examination:  General appearance: alert, cooperative and no distress Extremities: Homans sign is negative,  no sign of DVT  Wound Exam: clean, dry, intact   Drainage:  Scant/small amount Serosanguinous exudate  Motor Exam: EHL and FHL Intact  Sensory Exam: Deep Peroneal normal   Assessment:    1 Day Post-Op  Procedure(s) (LRB): TOTAL KNEE ARTHROPLASTY (Right)  ADDITIONAL DIAGNOSIS:  Active Problems:   S/P total knee arthroplasty  Acute Blood Loss Anemia   Plan: Physical Therapy as ordered Weight Bearing as Tolerated (WBAT)  DVT Prophylaxis:  Lovenox  DISCHARGE PLAN: Home  DISCHARGE NEEDS: HHPT, CPM, Walker, 3-in-1 comode seat and immobilizer         Villarreal,Brandon 07/30/2013, 11:51 AM

## 2013-07-30 NOTE — Progress Notes (Signed)
Physical Therapy Treatment Patient Details Name: Brandon Villarreal MRN: [id number] DOB: 05/01/1950 Today's Date: 07/30/2013    History of Present Illness Pt admitted for elective R TKA. Pt with h/o of L TKA 12 yrs ago and obesity.    PT Comments    Patient progressing very well from this morning. Patient and wife working hard on his transfer and now able to stand with min A. Patient able to complete stair training this PM and is eager to home this afternoon. PA paged and aware.   Follow Up Recommendations  Home health PT;Supervision/Assistance - 24 hour     Equipment Recommendations  None recommended by PT    Recommendations for Other Services       Precautions / Restrictions Precautions Precautions: Knee;Fall Required Braces or Orthoses: Knee Immobilizer - Right Knee Immobilizer - Right: On when out of bed or walking Restrictions RLE Weight Bearing: Weight bearing as tolerated    Mobility  Bed Mobility               General bed mobility comments: Patient sitting up on EOB upon arrival. Wife able to assist out of bed per her report  Transfers Overall transfer level: Needs assistance Equipment used: Rolling walker (2 wheeled) Transfers: Sit to/from Stand Sit to Stand: Min assist         General transfer comment: Patient only requiring Min A for stand this afternoon and wife able to assist. Cues for technique  Ambulation/Gait Ambulation/Gait assistance: Min guard Ambulation Distance (Feet): 100 Feet Assistive device: Rolling walker (2 wheeled) Gait Pattern/deviations: Step-to pattern;Decreased step length - right;Decreased step length - left Gait velocity: decreased   General Gait Details: Cues for upright posture and not to lean over RW   Stairs Stairs: Yes Stairs assistance: Min assist Stair Management: Step to pattern;Sideways;One rail Right Number of Stairs: 2 General stair comments: Patient able to ascend two steps with min A to ensure balance. Cues  for sequency and technique  Wheelchair Mobility    Modified Rankin (Stroke Patients Only)       Balance                                    Cognition Arousal/Alertness: Awake/alert Behavior During Therapy: WFL for tasks assessed/performed Overall Cognitive Status: Within Functional Limits for tasks assessed                      Exercises      General Comments        Pertinent Vitals/Pain no apparent distress     Home Living                      Prior Function            PT Goals (current goals can now be found in the care plan section) Progress towards PT goals: Progressing toward goals    Frequency  7X/week    PT Plan Current plan remains appropriate    Co-evaluation             End of Session Equipment Utilized During Treatment: Gait belt Activity Tolerance: Patient tolerated treatment well Patient left: in chair;with call bell/phone within reach;with family/visitor present     Time: 1345-1443 PT Time Calculation (min): 58 min  Charges:  $Gait Training: 23-37 mins $Therapeutic Activity: 23-37 mins  G Codes:      Fredrich BirksRobinette, Julia Elizabeth 07/30/2013, 3:05 PM 07/30/2013 Fredrich Birksobinette, Julia Elizabeth PTA [phone] pager [phone] office

## 2013-07-30 NOTE — Evaluation (Signed)
Occupational Therapy Evaluation Patient Details Name: Brandon KettleRobert F Villarreal MRN: [id number] DOB: 02/22/1950 Today's Date: 07/30/2013    History of Present Illness Pt admitted for elective R TKA. Pt with h/o of L TKA 12 yrs ago and obesity.   Clinical Impression   Pt admitted with the above diagnoses and presents with below problem list. Pt will benefit from continued acute OT to address the below listed deficits and maximize independence with basic ADLs. PTA pt was independent with ADLs. Pt completes LB ADLs with mod to max A. Pt transfer level affected by surface height of seat and armrest.Pt needing +2 physical assistance during powering up from recliner level. Mod A given to right side, min A for balance given on left side. Pt able to sit>stand from 3n1 with min A. Cues for technique. Discussed with pt and spouse concerns about needing +2 assist from recliner level. Discussed home setup including height of seated surfaces, chair designs and transfer techniques. Consulted with PTA who plans to see pt again this afternoon. Pt c/o mild dizziness during session.         Follow Up Recommendations  Supervision/Assistance - 24 hour;No OT follow up    Equipment Recommendations  Other (comment) (has recommended equipment)    Recommendations for Other Services       Precautions / Restrictions Precautions Precautions: Knee;Fall Precaution Comments: reviewed Required Braces or Orthoses: Knee Immobilizer - Right Knee Immobilizer - Right: On when out of bed or walking Restrictions Weight Bearing Restrictions: Yes RLE Weight Bearing: Weight bearing as tolerated      Mobility Bed Mobility               General bed mobility comments: in recliner  Transfers Overall transfer level: Needs assistance Equipment used: Rolling walker (2 wheeled) Transfers: Sit to/from Stand Sit to Stand: +2 physical assistance;Mod assist (from recliner; )         General transfer comment: Pt needing +2  physical assistance during powering up from recliner level surface. Mod A given to right side, min A for balance given on left side. Pt able to sit>stand from 3n1 with min A. Cues for technique.    Balance Overall balance assessment: Needs assistance Sitting-balance support: Bilateral upper extremity supported;Feet supported Sitting balance-Leahy Scale: Fair     Standing balance support: Bilateral upper extremity supported;During functional activity Standing balance-Leahy Scale: Poor                              ADL Overall ADL's : Needs assistance/impaired Eating/Feeding: Set up;Sitting   Grooming: Set up;Standing;Sitting   Upper Body Bathing: Supervision/ safety;Sitting   Lower Body Bathing: Moderate assistance;Sit to/from stand Lower Body Bathing Details (indicate cue type and reason): sit/stand from 3n1 Upper Body Dressing : Set up;Sitting   Lower Body Dressing: Maximal assistance;Sit to/from stand;With adaptive equipment Lower Body Dressing Details (indicate cue type and reason): max A for sit>stand power up Toilet Transfer: Ambulation;BSC;RW;Minimal assistance   Toileting- Clothing Manipulation and Hygiene: Min guard;Sitting/lateral lean   Tub/ Shower Transfer: Ambulation;3 in 1;Rolling walker;Minimal assistance   Functional mobility during ADLs: Min guard;Rolling walker General ADL Comments: Pt needing +2 physical assistance during powering up from recliner level surface. Mod A given to rigt side min A for balance given on left side. Educated on technique and use of AE for safe completion of ADLs with knee precautions.      Vision  Perception     Praxis      Pertinent Vitals/Pain Moderate pain right knee. Increased activity during session.     Hand Dominance Right   Extremity/Trunk Assessment Upper Extremity Assessment Upper Extremity Assessment: Overall WFL for tasks assessed   Lower Extremity Assessment Lower  Extremity Assessment: Defer to PT evaluation       Communication Communication Communication: No difficulties   Cognition Arousal/Alertness: Awake/alert Behavior During Therapy: WFL for tasks assessed/performed Overall Cognitive Status: Within Functional Limits for tasks assessed                     General Comments       Exercises       Shoulder Instructions      Home Living Family/patient expects to be discharged to:: Private residence Living Arrangements: Spouse/significant other Available Help at Discharge: Family;Available 24 hours/day Type of Home: House Home Access: Stairs to enter Entergy CorporationEntrance Stairs-Number of Steps: 2 Entrance Stairs-Rails: Right Home Layout: One level     Bathroom Shower/Tub: Producer, television/film/videoWalk-in shower   Bathroom Toilet: Handicapped height Bathroom Accessibility: Yes How Accessible: Accessible via walker Home Equipment: Walker - 2 wheels;Shower seat;Adaptive equipment Adaptive Equipment: Reacher        Prior Functioning/Environment Level of Independence: Independent             OT Diagnosis: Acute pain;Generalized weakness   OT Problem List: Decreased strength;Decreased range of motion;Decreased activity tolerance;Impaired balance (sitting and/or standing);Decreased knowledge of use of DME or AE;Decreased knowledge of precautions;Obesity;Pain   OT Treatment/Interventions: Self-care/ADL training;Therapeutic exercise;DME and/or AE instruction;Therapeutic activities;Patient/family education;Balance training    OT Goals(Current goals can be found in the care plan section) Acute Rehab OT Goals Patient Stated Goal: not stated OT Goal Formulation: With patient/family Time For Goal Achievement: 08/06/13 Potential to Achieve Goals: Good ADL Goals Pt Will Perform Lower Body Bathing: with adaptive equipment;with min guard assist;sit to/from stand Pt Will Perform Lower Body Dressing: with min guard assist;with adaptive equipment;sit to/from  stand Pt Will Transfer to Toilet: with min guard assist;ambulating (3n1 over toilet) Pt Will Perform Toileting - Clothing Manipulation and hygiene: with min guard assist;sit to/from stand Pt Will Perform Tub/Shower Transfer: with min guard assist;ambulating;3 in 1;rolling walker Additional ADL Goal #1: Pt will complete sit>stand from recliner level height with min A  to faciliate completion of LB ADLs and functional mobility.  OT Frequency: Min 3X/week   Barriers to D/C:    discussed with pt and spouse concerns about needing +2 assist from recliner level. Discussed home setup including height of seated surfaces, chair designs and transfer techniques. Consulted with PTA who plans to see pt again this afternoon.       Co-evaluation              End of Session Equipment Utilized During Treatment: Gait belt;Rolling walker CPM Right Knee Additional Comments: Pt returned to footsie at end of session  Activity Tolerance: Patient tolerated treatment well;Other (comment) (limited by mild dizziness) Patient left: in chair;with call bell/phone within reach;with family/visitor present   Time: [id number] OT Time Calculation (min): 34 min Charges:  OT General Charges $OT Visit: 1 Procedure OT Evaluation $Initial OT Evaluation Tier I: 1 Procedure OT Treatments $Self Care/Home Management : 23-37 mins G-Codes:    Pilar GrammesMathews, Kathryn H 07/30/2013, 10:37 AM

## 2013-08-02 ENCOUNTER — Encounter (HOSPITAL_COMMUNITY): Payer: Self-pay | Admitting: Emergency Medicine

## 2013-08-02 ENCOUNTER — Emergency Department (HOSPITAL_COMMUNITY)
Admission: EM | Admit: 2013-08-02 | Discharge: 2013-08-02 | Disposition: A | Discharge status: Home / Self Care | Payer: 59 | Source: Home / Self Care | Attending: Family Medicine | Admitting: Family Medicine

## 2013-08-02 DIAGNOSIS — Z96659 Presence of unspecified artificial knee joint: Secondary | ICD-10-CM

## 2013-08-02 DIAGNOSIS — T148XXA Other injury of unspecified body region, initial encounter: Secondary | ICD-10-CM

## 2013-08-02 DIAGNOSIS — Z96651 Presence of right artificial knee joint: Secondary | ICD-10-CM

## 2013-08-02 LAB — CBC
HEMATOCRIT: 30.8 % — AB (ref 39.0–52.0)
HEMOGLOBIN: 10.2 g/dL — AB (ref 13.0–17.0)
MCH: 27.6 pg (ref 26.0–34.0)
MCHC: 33.1 g/dL (ref 30.0–36.0)
MCV: 83.5 fL (ref 78.0–100.0)
Platelets: 222 10*3/uL (ref 150–400)
RBC: 3.69 MIL/uL — AB (ref 4.22–5.81)
RDW: 14.6 % (ref 11.5–15.5)
WBC: 9.5 10*3/uL (ref 4.0–10.5)

## 2013-08-02 LAB — POCT I-STAT, CHEM 8
BUN: 22 mg/dL (ref 6–23)
CALCIUM ION: 1.07 mmol/L — AB (ref 1.13–1.30)
CHLORIDE: 98 meq/L (ref 96–112)
CREATININE: 1 mg/dL (ref 0.50–1.35)
Glucose, Bld: 161 mg/dL — ABNORMAL HIGH (ref 70–99)
HCT: 32 % — ABNORMAL LOW (ref 39.0–52.0)
Hemoglobin: 10.9 g/dL — ABNORMAL LOW (ref 13.0–17.0)
Potassium: 4.3 mEq/L (ref 3.7–5.3)
Sodium: 137 mEq/L (ref 137–147)
TCO2: 25 mmol/L (ref 0–100)

## 2013-08-02 MED ORDER — CEPHALEXIN 500 MG PO CAPS: 500.0000 mg | ORAL_CAPSULE | Freq: Four times a day (QID) | ORAL | Status: DC

## 2013-08-02 NOTE — ED Provider Notes (Signed)
Brandon KettleRobert F Hovland is a 63 y.o. male who presents to Urgent Care today for right knee erythema. Patient had a total knee replacement performed by Dr. Valentina GuLucy 4 days prior. Today his knee was unwrapped for the first time and he had significant redness around the surgical site. He denies any significant pain or fevers. No nausea vomiting or diarrhea. He was advised to go to the urgent care for evaluation.   Past Medical History  Diagnosis Date  . Hypertension   . Hyperlipidemia   . ED (erectile dysfunction)   . Obesity   . Sensory neuropathy   . Microproteinuria   . Venous stasis   . Diabetes mellitus     fasting 90-120  . Arthritis    History  Substance Use Topics  . Smoking status: Never Smoker   . Smokeless tobacco: Not on file  . Alcohol Use: No   ROS as above Medications: No current facility-administered medications for this encounter.   Current Outpatient Prescriptions  Medication Sig Dispense Refill  . amLODipine (NORVASC) 10 MG tablet Take 0.5 tablets (5 mg total) by mouth 2 (two) times daily.  90 tablet  1  . benazepril (LOTENSIN) 40 MG tablet Take 1 tablet (40 mg total) by mouth daily.  90 tablet  1  . Canagliflozin (INVOKANA) 100 MG TABS Take 1 tablet (100 mg total) by mouth every morning.  90 tablet  1  . doxazosin (CARDURA) 2 MG tablet Take 1 tablet (2 mg total) by mouth daily.  90 tablet  1  . enoxaparin (LOVENOX) 40 MG/0.4ML injection Inject 0.4 mLs (40 mg total) into the skin daily.  13 Syringe  0  . fenofibrate 160 MG tablet Take 1 tablet (160 mg total) by mouth daily.  90 tablet  1  . furosemide (LASIX) 40 MG tablet Take 1 tablet (40 mg total) by mouth daily.  90 tablet  1  . glyBURIDE-metformin (GLUCOVANCE) 5-500 MG per tablet Take 2 tablets by mouth 2 (two) times daily with a meal.  360 tablet  1  . ibuprofen (ADVIL,MOTRIN) 200 MG tablet Take 400 mg by mouth every 6 (six) hours as needed for moderate pain.      Marland Kitchen. insulin glargine (LANTUS) 100 UNIT/ML injection Inject  0.98 mLs (98 Units total) into the skin at bedtime.  10 mL  1  . methocarbamol (ROBAXIN) 500 MG tablet Take 1-2 tablets (500-1,000 mg total) by mouth every 6 (six) hours as needed for muscle spasms.  60 tablet  2  . oxyCODONE (OXY IR/ROXICODONE) 5 MG immediate release tablet Take 1-2 tablets (5-10 mg total) by mouth every 3 (three) hours as needed for breakthrough pain.  90 tablet  0  . OxyCODONE (OXYCONTIN) 10 mg T12A 12 hr tablet Take 1 tablet (10 mg total) by mouth every 12 (twelve) hours.  60 tablet  0  . potassium chloride SA (K-DUR,KLOR-CON) 20 MEQ tablet Take 1 tablet (20 mEq total) by mouth 2 (two) times daily.  180 tablet  1  . pravastatin (PRAVACHOL) 20 MG tablet Take 1 tablet (20 mg total) by mouth every evening.  90 tablet  1  . cephALEXin (KEFLEX) 500 MG capsule Take 1 capsule (500 mg total) by mouth 4 (four) times daily.  40 capsule  0    Exam:  BP 136/88  Pulse 116  Temp(Src) 98.6 F (37 C) (Oral)  Resp 20  SpO2 97% Gen: Well NAD HEENT: EOMI,  MMM Lungs: Normal work of breathing. CTABL Heart: Tachycardia but  regular no MRG Abd: NABS, Soft. NT, ND Exts: Brisk capillary refill, warm and well perfused.  Right knee: Erythema surrounding the midline incision. No exudate. Moderate effusion.   Results for orders placed during the hospital encounter of 08/02/13 (from the past 24 hour(s))  CBC     Status: Abnormal   Collection Time    08/02/13  6:52 PM      Result Value Ref Range   WBC 9.5  4.0 - 10.5 K/uL   RBC 3.69 (*) 4.22 - 5.81 MIL/uL   Hemoglobin 10.2 (*) 13.0 - 17.0 g/dL   HCT 16.130.8 (*) 09.639.0 - 04.552.0 %   MCV 83.5  78.0 - 100.0 fL   MCH 27.6  26.0 - 34.0 pg   MCHC 33.1  30.0 - 36.0 g/dL   RDW 40.914.6  81.111.5 - 91.415.5 %   Platelets 222  150 - 400 K/uL  POCT I-STAT, CHEM 8     Status: Abnormal   Collection Time    08/02/13  6:55 PM      Result Value Ref Range   Sodium 137  137 - 147 mEq/L   Potassium 4.3  3.7 - 5.3 mEq/L   Chloride 98  96 - 112 mEq/L   BUN 22  6 - 23  mg/dL   Creatinine, Ser 7.821.00  0.50 - 1.35 mg/dL   Glucose, Bld 956161 (*) 70 - 99 mg/dL   Calcium, Ion 2.131.07 (*) 1.13 - 1.30 mmol/L   TCO2 25  0 - 100 mmol/L   Hemoglobin 10.9 (*) 13.0 - 17.0 g/dL   HCT 08.632.0 (*) 57.839.0 - 46.952.0 %   No results found.  Assessment and Plan: 63 y.o. male with right knee postsurgical hematoma.  Discussed the case with the on-call orthopedic physician Dr. Wandra Feinstein Murphy who discussed with Dr. Valentina GuLucy. Very doubtful for postsurgical infection. Most likely hematoma. I agree. Plan for treatment with Keflex is a percussion. Recommend discontinuation of CPM and physical therapy.  Followup with Dr. Valentina GuLucy on Monday 2 PM.   Discussed warning signs or symptoms. Please see discharge instructions. Patient expresses understanding.    Rodolph BongEvan S Shamicka Inga, MD 08/02/13 [phone]

## 2013-08-02 NOTE — ED Notes (Signed)
Redness at surgical site noted today when physical therapist came to the house.  Had fever of 99.5.  [facility] and was told they could not see him, because the right kind of doctor was not there.  They sent him here to be seen. Total knee replacement surgery R knee on Monday. Pain not any worse than when he had the surgery.

## 2013-08-02 NOTE — Discharge Instructions (Signed)
Thank you for coming in today. Take keflex 4 x daily  Follow up with Dr. Valentina GuLucy Go to the emergency room if you develop a high fever or have puss coming from the wound or you get dramatically worse.   Hematoma A hematoma is a collection of blood under the skin, in an organ, in a body space, in a joint space, or in other tissue. The blood can clot to form a lump that you can see and feel. The lump is often firm and may sometimes become sore and tender. Most hematomas get better in a few days to weeks. However, some hematomas may be serious and require medical care. Hematomas can range in size from very small to very large. CAUSES  A hematoma can be caused by a blunt or penetrating injury. It can also be caused by spontaneous leakage from a blood vessel under the skin. Spontaneous leakage from a blood vessel is more likely to occur in older people, especially those taking blood thinners. Sometimes, a hematoma can develop after certain medical procedures. SIGNS AND SYMPTOMS   A firm lump on the body.  Possible pain and tenderness in the area.  Bruising.Blue, dark blue, purple-red, or yellowish skin may appear at the site of the hematoma if the hematoma is close to the surface of the skin. For hematomas in deeper tissues or body spaces, the signs and symptoms may be subtle. For example, an intra-abdominal hematoma may cause abdominal pain, weakness, fainting, and shortness of breath. An intracranial hematoma may cause a headache or symptoms such as weakness, trouble speaking, or a change in consciousness. DIAGNOSIS  A hematoma can usually be diagnosed based on your medical history and a physical exam. Imaging tests may be needed if your health care provider suspects a hematoma in deeper tissues or body spaces, such as the abdomen, head, or chest. These tests may include ultrasonography or a CT scan.  TREATMENT  Hematomas usually go away on their own over time. Rarely does the blood need to be drained  out of the body. Large hematomas or those that may affect vital organs will sometimes need surgical drainage or monitoring. HOME CARE INSTRUCTIONS   Apply ice to the injured area:   Put ice in a plastic bag.   Place a towel between your skin and the bag.   Leave the ice on for 20 minutes, 2-3 times a day for the first 1 to 2 days.   After the first 2 days, switch to using warm compresses on the hematoma.   Elevate the injured area to help decrease pain and swelling. Wrapping the area with an elastic bandage may also be helpful. Compression helps to reduce swelling and promotes shrinking of the hematoma. Make sure the bandage is not wrapped too tight.   If your hematoma is on a lower extremity and is painful, crutches may be helpful for a couple days.   Only take over-the-counter or prescription medicines as directed by your health care provider. SEEK IMMEDIATE MEDICAL CARE IF:   You have increasing pain, or your pain is not controlled with medicine.   You have a fever.   You have worsening swelling or discoloration.   Your skin over the hematoma breaks or starts bleeding.   Your hematoma is in your chest or abdomen and you have weakness, shortness of breath, or a change in consciousness.  Your hematoma is on your scalp (caused by a fall or injury) and you have a worsening headache or a  change in alertness or consciousness. MAKE SURE YOU:   Understand these instructions.  Will watch your condition.  Will get help right away if you are not doing well or get worse. Document Released: 09/01/2003 Document Revised: 09/19/2012 Document Reviewed: 06/27/2012 [facility] Patient Information 2015 [employer] [place]. This information is not intended to replace advice given to you by your health care provider. Make sure you discuss any questions you have with your health care provider.

## 2013-08-05 NOTE — Discharge Summary (Signed)
SPORTS MEDICINE & JOINT REPLACEMENT   Stephen Lucey, MD   Altamese CabalMaurice Jamyrah Saur, PA-C [address], [place], [place]  [address]                             [phone]  PATIENT ID: Brandon Villarreal        MRN:  [id number]          DOB/AGE: 62/09/1950 / 63 y.o.    DISCHARGE SUMMARY  ADMISSION DATE:    07/29/2013 DISCHARGE DATE:   07/30/2013  ADMISSION DIAGNOSIS: osteoarthritis right knee    DISCHARGE DIAGNOSIS:  osteoarthritis right knee    ADDITIONAL DIAGNOSIS: Active Problems:   S/P total knee arthroplasty  Past Medical History  Diagnosis Date  . Hypertension   . Hyperlipidemia   . ED (erectile dysfunction)   . Obesity   . Sensory neuropathy   . Microproteinuria   . Venous stasis   . Diabetes mellitus     fasting 90-120  . Arthritis     PROCEDURE: Procedure(s): TOTAL KNEE ARTHROPLASTY on 07/29/2013  CONSULTS:     HISTORY:  See H&P in chart  HOSPITAL COURSE:  Brandon Villarreal is a 63 y.o. admitted on 07/29/2013 and found to have a diagnosis of osteoarthritis right knee.  After appropriate laboratory studies were obtained  they were taken to the operating room on 07/29/2013 and underwent Procedure(s): TOTAL KNEE ARTHROPLASTY.   They were given perioperative antibiotics:  Anti-infectives   Start     Dose/Rate Route Frequency Ordered Stop   07/29/13 1600  ceFAZolin (ANCEF) IVPB 2 g/50 mL premix     2 g 100 mL/hr over 30 Minutes Intravenous Every 6 hours 07/29/13 1428 07/29/13 2247   07/29/13 0600  ceFAZolin (ANCEF) 3 g in dextrose 5 % 50 mL IVPB     3 g 160 mL/hr over 30 Minutes Intravenous On call to O.R. 07/28/13 1235 07/29/13 0842    .  Tolerated the procedure well.  Placed with a foley intraoperatively.  Given Ofirmev at induction and for 48 hours.    POD# 1: Vital signs were stable.  Patient denied Chest pain, shortness of breath, or calf pain.  Patient was started on Lovenox 30 mg subcutaneously twice daily at 8am.  Consults to PT, OT, and care management were  made.  The patient was weight bearing as tolerated.  CPM was placed on the operative leg 0-90 degrees for 6-8 hours a day.  Incentive spirometry was taught.  Dressing was changed.  Marcaine pump and hemovac were discontinued.      POD #2, Continued  PT for ambulation and exercise program.  IV saline locked.  O2 discontinued.    The remainder of the hospital course was dedicated to ambulation and strengthening.   The patient was discharged on 1 day post op in  Good condition.  Blood products given:none  DIAGNOSTIC STUDIES: Recent vital signs: No data found.      Recent laboratory studies:  Recent Labs  07/29/13 1528 07/30/13 0650 08/02/13 1852 08/02/13 1855  WBC 12.5* 9.7 9.5  --   HGB 12.5* 11.3* 10.2* 10.9*  HCT 37.4* 33.8* 30.8* 32.0*  PLT 170 145* 222  --     Recent Labs  07/29/13 1528 07/30/13 0650 08/02/13 1855  NA  --  140 137  K  --  5.1 4.3  CL  --  102 98  CO2  --  26  --  BUN  --  24* 22  CREATININE 0.71 0.92 1.00  GLUCOSE  --  201* 161*  CALCIUM  --  8.1*  --    Lab Results  Component Value Date   INR 0.99 07/19/2013     Recent Radiographic Studies :  Dg Chest 2 View  07/19/2013   CLINICAL DATA:  Preoperative evaluation.  EXAM: CHEST  2 VIEW  COMPARISON:  09/06/2006  FINDINGS: The heart size and mediastinal contours are within normal limits. Both lungs are clear. The visualized skeletal structures are unremarkable.  IMPRESSION: No active cardiopulmonary disease.   Electronically Signed   By: Alcide Clever M.D.   On: 07/19/2013 08:40    DISCHARGE INSTRUCTIONS: Discharge Instructions   CPM    Complete by:  As directed   Continuous passive motion machine (CPM):      Use the CPM from 0 to 90 for 6-8 hours per day.      You may increase by 10 per day.  You may break it up into 2 or 3 sessions per day.      Use CPM for 2 weeks or until you are told to stop.     Call MD / Call 911    Complete by:  As directed   If you experience chest pain or shortness  of breath, CALL 911 and be transported to the hospital emergency room.  If you develope a fever above 101 F, pus (white drainage) or increased drainage or redness at the wound, or calf pain, call your surgeon's office.     Change dressing    Complete by:  As directed   Change dressing on wednesday, then change the dressing daily with sterile 4 x 4 inch gauze dressing and apply TED hose.     Constipation Prevention    Complete by:  As directed   Drink plenty of fluids.  Prune juice may be helpful.  You may use a stool softener, such as Colace (over the counter) 100 mg twice a day.  Use MiraLax (over the counter) for constipation as needed.     Diet - low sodium heart healthy    Complete by:  As directed      Do not put a pillow under the knee. Place it under the heel.    Complete by:  As directed      Driving restrictions    Complete by:  As directed   No driving for 6 weeks     Increase activity slowly as tolerated    Complete by:  As directed      Lifting restrictions    Complete by:  As directed   No lifting for 6 weeks     TED hose    Complete by:  As directed   Use stockings (TED hose) for 3 weeks on both leg(s).  You may remove them at night for sleeping.           DISCHARGE MEDICATIONS:     Medication List    STOP taking these medications       aspirin 81 MG tablet      TAKE these medications       amLODipine 10 MG tablet  Commonly known as:  NORVASC  Take 0.5 tablets (5 mg total) by mouth 2 (two) times daily.     benazepril 40 MG tablet  Commonly known as:  LOTENSIN  Take 1 tablet (40 mg total) by mouth daily.     Canagliflozin 100 MG  Tabs  Commonly known as:  INVOKANA  Take 1 tablet (100 mg total) by mouth every morning.     doxazosin 2 MG tablet  Commonly known as:  CARDURA  Take 1 tablet (2 mg total) by mouth daily.     enoxaparin 40 MG/0.4ML injection  Commonly known as:  LOVENOX  Inject 0.4 mLs (40 mg total) into the skin daily.     fenofibrate  160 MG tablet  Take 1 tablet (160 mg total) by mouth daily.     furosemide 40 MG tablet  Commonly known as:  LASIX  Take 1 tablet (40 mg total) by mouth daily.     glyBURIDE-metformin 5-500 MG per tablet  Commonly known as:  GLUCOVANCE  Take 2 tablets by mouth 2 (two) times daily with a meal.     ibuprofen 200 MG tablet  Commonly known as:  ADVIL,MOTRIN  Take 400 mg by mouth every 6 (six) hours as needed for moderate pain.     insulin glargine 100 UNIT/ML injection  Commonly known as:  LANTUS  Inject 0.98 mLs (98 Units total) into the skin at bedtime.     methocarbamol 500 MG tablet  Commonly known as:  ROBAXIN  Take 1-2 tablets (500-1,000 mg total) by mouth every 6 (six) hours as needed for muscle spasms.     oxyCODONE 5 MG immediate release tablet  Commonly known as:  Oxy IR/ROXICODONE  Take 1-2 tablets (5-10 mg total) by mouth every 3 (three) hours as needed for breakthrough pain.     OxyCODONE 10 mg T12a 12 hr tablet  Commonly known as:  OXYCONTIN  Take 1 tablet (10 mg total) by mouth every 12 (twelve) hours.     potassium chloride SA 20 MEQ tablet  Commonly known as:  K-DUR,KLOR-CON  Take 1 tablet (20 mEq total) by mouth 2 (two) times daily.     pravastatin 20 MG tablet  Commonly known as:  PRAVACHOL  Take 1 tablet (20 mg total) by mouth every evening.        FOLLOW UP VISIT:       Follow-up Information   Follow up with Raymon MuttonLUCEY,STEPHEN D, MD. Call on 08/13/2013.   Specialty:  Orthopedic Surgery   Contact information:   [address] [place] [address] [phone]       DISPOSITION: HOME   CONDITION:  Good   Julies Carmickle 08/05/2013, 12:44 PM

## 2013-08-15 ENCOUNTER — Telehealth (HOSPITAL_COMMUNITY): Payer: Self-pay

## 2013-08-21 ENCOUNTER — Telehealth: Payer: Self-pay | Admitting: Family Medicine

## 2013-08-21 ENCOUNTER — Ambulatory Visit (HOSPITAL_COMMUNITY): Payer: 59 | Admitting: Physical Therapy

## 2013-08-21 NOTE — Telephone Encounter (Signed)
Patient on Lantis 98 units at bedtime, Glucovance 5/500 2 tabs twice a day, Invokana 100mg  in am

## 2013-08-21 NOTE — Telephone Encounter (Signed)
Patient notified and verbalized understanding to decrease lantus to 86 units.

## 2013-08-21 NOTE — Telephone Encounter (Signed)
Dec lantus to [phone]

## 2013-08-21 NOTE — Telephone Encounter (Signed)
Pt calling to say his blood sugars have dropped the last  4-5 five mornings 70 or below. He does not check them  During the day. He feels sweaty, wakes up sweaty,  Tired feeling when he wakes up   Had surgery on the 29th of June and has lost a few pounds Since the surgery (he thinks he weighs around 330 now)   Please advise pt if he needs to change his dosage?   [facility]

## 2013-08-27 ENCOUNTER — Ambulatory Visit (HOSPITAL_COMMUNITY): Payer: 59 | Admitting: Physical Therapy

## 2013-08-29 ENCOUNTER — Ambulatory Visit (HOSPITAL_COMMUNITY): Payer: 59

## 2013-09-03 ENCOUNTER — Ambulatory Visit (HOSPITAL_COMMUNITY): Payer: 59 | Admitting: Physical Therapy

## 2013-09-05 ENCOUNTER — Ambulatory Visit (HOSPITAL_COMMUNITY): Payer: 59 | Admitting: Physical Therapy

## 2013-09-10 ENCOUNTER — Ambulatory Visit (HOSPITAL_COMMUNITY): Payer: 59

## 2013-09-12 ENCOUNTER — Ambulatory Visit (HOSPITAL_COMMUNITY): Payer: 59

## 2013-09-17 ENCOUNTER — Ambulatory Visit (HOSPITAL_COMMUNITY): Payer: 59

## 2013-09-19 ENCOUNTER — Ambulatory Visit (HOSPITAL_COMMUNITY): Payer: 59

## 2013-10-03 ENCOUNTER — Ambulatory Visit (INDEPENDENT_AMBULATORY_CARE_PROVIDER_SITE_OTHER): Payer: 59 | Admitting: Family Medicine

## 2013-10-03 ENCOUNTER — Encounter: Payer: Self-pay | Admitting: Family Medicine

## 2013-10-03 VITALS — BP 136/84 | Ht 74.0 in | Wt 327.4 lb

## 2013-10-03 DIAGNOSIS — I872 Venous insufficiency (chronic) (peripheral): Secondary | ICD-10-CM

## 2013-10-03 DIAGNOSIS — G629 Polyneuropathy, unspecified: Secondary | ICD-10-CM

## 2013-10-03 DIAGNOSIS — E785 Hyperlipidemia, unspecified: Secondary | ICD-10-CM

## 2013-10-03 DIAGNOSIS — G609 Hereditary and idiopathic neuropathy, unspecified: Secondary | ICD-10-CM

## 2013-10-03 DIAGNOSIS — E1149 Type 2 diabetes mellitus with other diabetic neurological complication: Secondary | ICD-10-CM

## 2013-10-03 DIAGNOSIS — I878 Other specified disorders of veins: Secondary | ICD-10-CM

## 2013-10-03 DIAGNOSIS — I10 Essential (primary) hypertension: Secondary | ICD-10-CM

## 2013-10-03 NOTE — Progress Notes (Signed)
   Subjective:    Patient ID: Brandon Villarreal, male    DOB: 09-11-1950, 63 y.o.   MRN: [id number]  Diabetes He presents for his follow-up diabetic visit. He has type 2 diabetes mellitus. His disease course has been stable. There are no hypoglycemic associated symptoms. There are no diabetic associated symptoms. There are no hypoglycemic complications. Symptoms are stable. There are no diabetic complications. There are no known risk factors for coronary artery disease. Current diabetic treatment includes insulin injections and oral agent (dual therapy). He is compliant with treatment all of the time.   Last HgbA1C was done on 07/29/13, too early to have one done today.  Patient states he has no other concerns at this time.   Only one low sugar spell  Pt has hx of htn, comp with meds. No obvious side effects the medicine. Trying to watch his salt intake.  On cholesterol medicine. No obvious side effects. Realizes importance of watching cholesterol in diet.  Just had knee replacement. Overall handling physical therapy well.   Results for orders placed during the hospital encounter of 08/02/13  CBC      Result Value Ref Range   WBC 9.5  4.0 - 10.5 K/uL   RBC 3.69 (*) 4.22 - 5.81 MIL/uL   Hemoglobin 10.2 (*) 13.0 - 17.0 g/dL   HCT 96.2 (*) 95.2 - 84.1 %   MCV 83.5  78.0 - 100.0 fL   MCH 27.6  26.0 - 34.0 pg   MCHC 33.1  30.0 - 36.0 g/dL   RDW 32.4  40.1 - 02.7 %   Platelets 222  150 - 400 K/uL  POCT I-STAT, CHEM 8      Result Value Ref Range   Sodium 137  137 - 147 mEq/L   Potassium 4.3  3.7 - 5.3 mEq/L   Chloride 98  96 - 112 mEq/L   BUN 22  6 - 23 mg/dL   Creatinine, Ser 2.53  0.50 - 1.35 mg/dL   Glucose, Bld 664 (*) 70 - 99 mg/dL   Calcium, Ion 4.03 (*) 1.13 - 1.30 mmol/L   TCO2 25  0 - 100 mmol/L   Hemoglobin 10.9 (*) 13.0 - 17.0 g/dL   HCT 47.4 (*) 25.9 - 56.3 %        Review of Systems No chest pain no headache no back pain no abdominal pain no change about habits no  blood in stool    Objective:   Physical Exam  Alert substantial obesity present. Blood pressure good on repeat. Lungs clear. Heart rate and rhythm. Ankles without edema sensation diminished diffusely distally pulses intact.      Assessment & Plan:  Impression 1 type 2 diabetes control improving #2 hypertension decent control. #3 hyperlipidemia see prior notes. #4 sensory neuropathy ongoing plan diet exercise discussed. Encouraged to maintain and improve. Maintain same medications. Cut down salt intake. Recheck as scheduled. WSL

## 2013-10-22 ENCOUNTER — Ambulatory Visit: Payer: 59 | Admitting: Family Medicine

## 2014-01-01 ENCOUNTER — Other Ambulatory Visit: Payer: Self-pay | Admitting: Family Medicine

## 2014-01-02 ENCOUNTER — Ambulatory Visit (INDEPENDENT_AMBULATORY_CARE_PROVIDER_SITE_OTHER): Payer: 59 | Admitting: Family Medicine

## 2014-01-02 ENCOUNTER — Encounter: Payer: Self-pay | Admitting: Family Medicine

## 2014-01-02 VITALS — BP 152/90 | Ht 74.0 in | Wt 347.0 lb

## 2014-01-02 DIAGNOSIS — I10 Essential (primary) hypertension: Secondary | ICD-10-CM

## 2014-01-02 DIAGNOSIS — E118 Type 2 diabetes mellitus with unspecified complications: Secondary | ICD-10-CM

## 2014-01-02 DIAGNOSIS — E1143 Type 2 diabetes mellitus with diabetic autonomic (poly)neuropathy: Secondary | ICD-10-CM

## 2014-01-02 DIAGNOSIS — E785 Hyperlipidemia, unspecified: Secondary | ICD-10-CM

## 2014-01-02 LAB — POCT GLYCOSYLATED HEMOGLOBIN (HGB A1C): Hemoglobin A1C: 8.1

## 2014-01-02 MED ORDER — CANAGLIFLOZIN 300 MG PO TABS
300.0000 mg | ORAL_TABLET | Freq: Every day | ORAL | Status: DC
Start: 2014-01-02 — End: 2014-02-05

## 2014-01-02 NOTE — Progress Notes (Signed)
   Subjective:    Patient ID: Brandon KettleRobert F Villarreal, male    DOB: 05/26/1950, 63 y.o.   MRN: [id number]  Diabetes He presents for his initial diabetic visit. He has type 2 diabetes mellitus. There are no hypoglycemic associated symptoms. ("sluggish") Current diabetic treatment includes insulin injections and oral agent (monotherapy). He is compliant with treatment all of the time. He monitors blood glucose at home 3-4 x per week. His overall blood glucose range is 130-140 mg/dl. He sees a podiatrist.Eye exam is not current.   Results for orders placed or performed in visit on 01/02/14  POCT glycosylated hemoglobin (Hb A1C)  Result Value Ref Range   Hemoglobin A1C 8.1     Flu shot given and covered  Taking 95 u of insulin   Review of Systems No headache no chest pain no back pain no shortness breath no abdominal pain no change in bowel habits    Objective:   Physical Exam Alert morbid obesity present blood pressure improved on repeat 138/84. H&T normal. Lungs clear. Heart regular rhythm. Ankles 1+ edema chronic venous stasis changes noted.       Assessment & Plan:  Impression 1 type 2 diabetes suboptimum control. #2 morbid obesity discussed. #3 chronic venous stasis changes discussed. #4 hypertension fair control. #5 hyperlipidemia plan insulin adjusted. Diet exercise discussed. Medication discussed. See diabetes medicine prescriptions. WSL

## 2014-02-05 ENCOUNTER — Other Ambulatory Visit: Payer: Self-pay | Admitting: *Deleted

## 2014-02-05 ENCOUNTER — Telehealth: Payer: Self-pay | Admitting: *Deleted

## 2014-02-05 MED ORDER — BENAZEPRIL HCL 40 MG PO TABS: 40.0000 mg | ORAL_TABLET | Freq: Every day | ORAL | Status: DC

## 2014-02-05 MED ORDER — PRAVASTATIN SODIUM 20 MG PO TABS: 20.0000 mg | ORAL_TABLET | Freq: Every evening | ORAL | Status: DC

## 2014-02-05 MED ORDER — DOXAZOSIN MESYLATE 2 MG PO TABS: 2.0000 mg | ORAL_TABLET | Freq: Every day | ORAL | Status: DC

## 2014-02-05 MED ORDER — INSULIN GLARGINE 100 UNIT/ML ~~LOC~~ SOLN: 98.0000 [IU] | Freq: Every day | SUBCUTANEOUS | Status: DC

## 2014-02-05 MED ORDER — FENOFIBRATE 160 MG PO TABS: 160.0000 mg | ORAL_TABLET | Freq: Every day | ORAL | Status: DC

## 2014-02-05 MED ORDER — AMLODIPINE BESYLATE 10 MG PO TABS: 5.0000 mg | ORAL_TABLET | Freq: Two times a day (BID) | ORAL | Status: DC

## 2014-02-05 MED ORDER — CANAGLIFLOZIN 300 MG PO TABS: 300.0000 mg | ORAL_TABLET | Freq: Every day | ORAL | Status: DC

## 2014-02-05 MED ORDER — GLYBURIDE-METFORMIN 5-500 MG PO TABS: 2.0000 | ORAL_TABLET | Freq: Two times a day (BID) | ORAL | Status: DC

## 2014-02-05 NOTE — Telephone Encounter (Signed)
[facility]. Request fro [facility] came over from [facility]. This med is not on pt's med list. It is on med history. Need to find out if pt is taking and how.

## 2014-02-07 ENCOUNTER — Ambulatory Visit (HOSPITAL_COMMUNITY)
Admission: RE | Admit: 2014-02-07 | Discharge: 2014-02-07 | Disposition: A | Discharge status: Home / Self Care | Payer: 59 | Source: Ambulatory Visit | Attending: Orthopedic Surgery | Admitting: Orthopedic Surgery

## 2014-02-07 DIAGNOSIS — Z96651 Presence of right artificial knee joint: Secondary | ICD-10-CM | POA: Insufficient documentation

## 2014-02-07 DIAGNOSIS — Z471 Aftercare following joint replacement surgery: Secondary | ICD-10-CM | POA: Diagnosis present

## 2014-02-07 DIAGNOSIS — M1711 Unilateral primary osteoarthritis, right knee: Secondary | ICD-10-CM

## 2014-02-07 DIAGNOSIS — M25661 Stiffness of right knee, not elsewhere classified: Secondary | ICD-10-CM | POA: Insufficient documentation

## 2014-02-07 DIAGNOSIS — R29898 Other symptoms and signs involving the musculoskeletal system: Secondary | ICD-10-CM

## 2014-02-07 NOTE — Therapy (Signed)
[facility] [address] [place], [place], [address] Phone: [phone]   Fax:  [phone]  Physical Therapy Evaluation  Patient Details  Name: Brandon Villarreal MRN: [id number] Date of Birth: [birth year] Referring Provider:  Dannielle Huh, MD  Encounter Date: 02/07/2014      PT End of Session - 02/07/14 1643    Visit Number 1   Number of Visits 12   Date for PT Re-Evaluation 03/09/14   Authorization Type [employer]   Authorization - Visit Number 1   Authorization - Number of Visits 12   PT Start Time 1603   PT Stop Time 1645   PT Time Calculation (min) 42 min   Activity Tolerance Patient tolerated treatment well   Behavior During Therapy [facility] for tasks assessed/performed      Past Medical History  Diagnosis Date  . Hypertension   . Hyperlipidemia   . ED (erectile dysfunction)   . Obesity   . Sensory neuropathy   . Microproteinuria   . Venous stasis   . Diabetes mellitus     fasting 90-120  . Arthritis     Past Surgical History  Procedure Laterality Date  . Replacement total knee      left  . Back surgery      lumbar disc  . Hammer toe surgery  02/10/2011    Procedure: HAMMER TOE CORRECTION;  Surgeon: Dallas Schimke;  Location: [facility];  Service: Orthopedics;  Laterality: Right;  Percutaneous Flexor Tenotomy third Toe Right Foot  . Joint replacement Left     knee  . Hernia repair      umbilical  . Total knee arthroplasty Right 07/29/2013    Procedure: TOTAL KNEE ARTHROPLASTY;  Surgeon: Dannielle Huh, MD;  Location: [facility] OR;  Service: Orthopedics;  Laterality: Right;    There were no vitals taken for this visit.  Visit Diagnosis:  Knee stiffness, right  Weakness of right hip  Ankle weakness  Primary osteoarthritis of right knee      Subjective Assessment - 02/07/14 1607    Symptoms primary complaint of weakness with sit to stand on Rt. Both knees hurt.    Pertinent History Rt knee replacement in june 2015. Primary  complain of stiffness in Rt knee and swelling. X-rays were WNL. Difficulty performing sit to stand. history of Left knee12-13 years ago. patient is a Production designer, theatre/television/film   How long can you walk comfortably? always hurts.    Currently in Pain? Yes   Pain Score 3    Pain Location Knee   Pain Orientation Right   Pain Descriptors / Indicators Aching   Pain Type Chronic pain   Pain Onset More than a month ago   Pain Frequency Constant   Aggravating Factors  weight bearing.    Pain Relieving Factors laying down and stretching out.           [facility] PT Assessment - 02/07/14 0001    Assessment   Medical Diagnosis weakness s/pp Rt knee replacement.    Onset Date 07/08/13   Next MD Visit Dannielle Huh 03/04/14   Prior Therapy yes but patient notes low intensity.    Balance Screen   Has the patient fallen in the past 6 months No   Has the patient had a decrease in activity level because of a fear of falling?  No   Is the patient reluctant to leave their home because of a fear of falling?  No   Prior  Function   Level of [place] [facility] with basic ADLs   Sit to Stand   Comments requires UE support, max depth withtou UE support is 19",    Other:   Other/ Comments Stairs: Bilateral UE support for 4" steps. able to perform 2" step without UE support    Other:   Other/Comments Unable to single leg balance.    AROM   Right Knee Extension 0   Right Knee Flexion 109   Strength   Right Hip Extension 2/5   Right Hip ABduction 2+/5   Left Hip Extension 2/5   Left Hip ABduction 2+/5   Right Knee Flexion 4/5   Right Knee Extension 4/5   Left Knee Flexion 5/5   Left Knee Extension 5/5   Right Ankle Dorsiflexion 4/5   Right Ankle Plantar Flexion 2-/5   Left Ankle Dorsiflexion 4/5   Left Ankle Plantar Flexion 2-/5                  [facility] Adult PT Treatment/Exercise - 02/07/14 0001    Knee/Hip Exercises: Standing   Heel Raises 10 reps;3 sets   Forward Lunges Both;10 reps    Functional Squat 10 reps   Other Standing Knee Exercises 3D hip excursions 10x                  PT Short Term Goals - 02/07/14 1853    PT SHORT TERM GOAL #1   Title Patient will be abel to stand from 18" chair without UE supprt   Time 2   Period Weeks   Status New   PT SHORT TERM GOAL #2   Title Patient will be able to perform a lunge to the floor and return to be able to walk with increased stride length   Time 2   Period Weeks   Status New   PT SHORT TERM GOAL #3   Title Patient will be able to ambualte up and down 4" stairs withotu UE supprt   Time 2   Period Weeks   Status New           PT Long Term Goals - 02/07/14 1859    PT LONG TERM GOAL #1   Title Patient will be abel to stand from 18" chair without UE supprt 5x in <15 seconds indicating patient not at increased risk for falls   Time 8   Period Weeks   Status New   PT LONG TERM GOAL #2   Title Patient will be able to single leg stand >3 seconds   Time 8   Period Weeks   Status New   PT LONG TERM GOAL #3   Title Patient will be able to ambualte up and down 8" stairs without UE supprt   Time 8   Period Weeks   Status New               Plan - 02/07/14 1851    Clinical Impression Statement patient displays limited functional strength resultign in requirign UE support for stair and sit to sand performance. patient will benefit from skileld phsyical therapy to increase LE strength to increase patient independence and fucntional threshold so patient can ambuale up and down stairs without UE support   Pt will benefit from skilled therapeutic intervention in order to improve on the following deficits Abnormal gait;Decreased endurance;Improper body mechanics;Decreased activity tolerance;Decreased strength;Impaired flexibility;Difficulty walking;Decreased balance;Decreased mobility;Decreased range of motion;Pain   Rehab Potential Good   PT Frequency 3x /  week   PT Duration 4 weeks   PT  Treatment/Interventions Gait training   PT Next Visit Plan Squats, heel/toe raises, lunges, stairs, sumo walk, also introduce LE stretches:    PT Home Exercise Plan squats, heel raises, lunges, 3D hip excursions.          Problem List Patient Active Problem List   Diagnosis Date Noted  . S/P total knee arthroplasty 07/29/2013  . Essential hypertension, benign 06/28/2012  . Diabetes 06/28/2012  . Hyperlipidemia LDL goal <100 06/28/2012  . Erectile dysfunction 06/28/2012  . Venous stasis 06/28/2012  . 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 Sensory neuropathy 06/28/2012   Jerilee Field PT DPT [phone]  [facility] [address] [place], [place], [address] Phone: [phone]   Fax:  [phone]

## 2014-02-07 NOTE — Patient Instructions (Signed)
Ankle Plantar Flexion / Dorsiflexion, Standing   Stand while holding a stable object. Rise up on toes. Then rock back on heels.  Repeat 10 times per session. Do 2 sessions per day.

## 2014-02-10 MED ORDER — POTASSIUM CHLORIDE CRYS ER 20 MEQ PO TBCR: EXTENDED_RELEASE_TABLET | ORAL | Status: DC

## 2014-02-10 NOTE — Telephone Encounter (Signed)
Patient states he is still on one potassium a day due to his Lasix- Rx sent electronically to pharmacy and added to med list.

## 2014-02-10 NOTE — Telephone Encounter (Signed)
Left message to return call 02/10/14

## 2014-02-11 ENCOUNTER — Ambulatory Visit (HOSPITAL_COMMUNITY)
Admission: RE | Admit: 2014-02-11 | Discharge: 2014-02-11 | Disposition: A | Discharge status: Home / Self Care | Payer: 59 | Source: Ambulatory Visit | Attending: Orthopedic Surgery | Admitting: Orthopedic Surgery

## 2014-02-11 DIAGNOSIS — M25661 Stiffness of right knee, not elsewhere classified: Secondary | ICD-10-CM

## 2014-02-11 DIAGNOSIS — Z471 Aftercare following joint replacement surgery: Secondary | ICD-10-CM | POA: Diagnosis not present

## 2014-02-11 DIAGNOSIS — R29898 Other symptoms and signs involving the musculoskeletal system: Secondary | ICD-10-CM

## 2014-02-11 DIAGNOSIS — M1711 Unilateral primary osteoarthritis, right knee: Secondary | ICD-10-CM

## 2014-02-11 NOTE — Therapy (Signed)
[facility] [address] [place], [place], [address] Phone: [phone]   Fax:  [phone]  Physical Therapy Treatment  Patient Details  Name: Brandon KettleRobert F Chillemi MRN: [id number] Date of Birth: 07/21/1950 Referring Provider:  Dannielle HuhLucey, Steve, MD  Encounter Date: 02/11/2014      PT End of Session - 02/11/14 1748    Visit Number 2   Number of Visits 12   Date for PT Re-Evaluation 03/09/14   Authorization Type UHC   Authorization - Visit Number 2   Authorization - Number of Visits 12   PT Start Time 1645   PT Stop Time 1732   PT Time Calculation (min) 47 min   Activity Tolerance Patient tolerated treatment well   Behavior During Therapy [facility] for tasks assessed/performed      Past Medical History  Diagnosis Date  . Hypertension   . Hyperlipidemia   . ED (erectile dysfunction)   . Obesity   . Sensory neuropathy   . Microproteinuria   . Venous stasis   . Diabetes mellitus     fasting 90-120  . Arthritis     Past Surgical History  Procedure Laterality Date  . Replacement total knee      left  . Back surgery      lumbar disc  . Hammer toe surgery  02/10/2011    Procedure: HAMMER TOE CORRECTION;  Surgeon: Dallas SchimkeBenjamin Ivan McKinney;  Location: [facility];  Service: Orthopedics;  Laterality: Right;  Percutaneous Flexor Tenotomy third Toe Right Foot  . Joint replacement Left     knee  . Hernia repair      umbilical  . Total knee arthroplasty Right 07/29/2013    Procedure: TOTAL KNEE ARTHROPLASTY;  Surgeon: Dannielle HuhSteve Lucey, MD;  Location: [facility];  Service: Orthopedics;  Laterality: Right;    There were no vitals taken for this visit.  Visit Diagnosis:  Knee stiffness, right  Weakness of right hip  Ankle weakness  Primary osteoarthritis of right knee      Subjective Assessment - 02/11/14 1658    Symptoms Currently pain free, compliant with HEP and no questions with exercise.   Currently in Pain? No/denies                    [facility] Adult PT Treatment/Exercise - 02/11/14 1704    Exercises   Exercises Knee/Hip   Knee/Hip Exercises: Stretches   Active Hamstring Stretch 3 reps;30 seconds   Active Hamstring Stretch Limitations 3 way on 14 in step   Knee: Self-Stretch to increase Flexion Limitations   Knee: Self-Stretch Limitations 10 x3" on 8in step for improve flexion   Gastroc Stretch 3 reps;30 seconds   Gastroc Stretch Limitations slant board   Knee/Hip Exercises: Standing   Heel Raises 2 sets;10 reps   Heel Raises Limitations Toe raises 2set x 10x   Forward Lunges Both;10 reps   Side Lunges Both;10 reps   Lateral Step Up Right;15 reps;Hand Hold: 1;Step Height: 4"   Forward Step Up Right;Hand Hold: 1;Step Height: 4";15 reps   Functional Squat 10 reps   Functional Squat Limitations 3D hip excursion   Other Standing Knee Exercises Sumo walking 1RT                  PT Short Term Goals - 02/11/14 1746    PT SHORT TERM GOAL #1   Title Patient will be abel to stand from 18" chair without UE supprt   Status On-going  PT SHORT TERM GOAL #2   Title Patient will be able to perform a lunge to the floor and return to be able to walk with increased stride length   Status On-going   PT SHORT TERM GOAL #3   Title Patient will be able to ambualte up and down 4" stairs withotu UE supprt   Status On-going           PT Long Term Goals - 02/11/14 1746    PT LONG TERM GOAL #1   Title Patient will be abel to stand from 18" chair without UE supprt 5x in <15 seconds indicating patient not at increased risk for falls   PT LONG TERM GOAL #2   Title Patient will be able to single leg stand >3 seconds   PT LONG TERM GOAL #3   Title Patient will be able to ambualte up and down 8" stairs without UE supprt               Plan - 02/11/14 1659    Clinical Impression Statement Noted increased edema with weaping anterior Bil LE, pt stated he had treatment for the swelling in the past and has new pain of  compression hose he plans of wearing tomorrow.  Pt educated importance of compression hose to reduce edema.  Discussion held with evaluation DPT about the swelling.  Reviewed HEP to assure confidence and proper technique with exercises, pt able to demonstrate appropriate technique with all exercises following min cueing for posture to improve weight loading.  Progressed session to stair training with cueing to reduce compensation due to weak musculature.  No reports of increased pain through session.     PT Next Visit Plan Continue with current PT POC including: Squats, heel/toe raises, lunges, stairs, sumo walk, also introduce LE stretches:         Problem List Patient Active Problem List   Diagnosis Date Noted  . S/P total knee arthroplasty 07/29/2013  . Essential hypertension, benign 06/28/2012  . Diabetes 06/28/2012  . Hyperlipidemia LDL goal <100 06/28/2012  . Erectile dysfunction 06/28/2012  . Venous stasis 06/28/2012  . 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 Sensory neuropathy 06/28/2012   Becky Sax, LPTA [phone]  Juel Burrow 02/11/2014, 5:49 PM   [facility] [address] [place], [place], [address] Phone: [phone]   Fax:  [phone]

## 2014-02-12 ENCOUNTER — Encounter (HOSPITAL_COMMUNITY): Payer: Self-pay

## 2014-02-12 ENCOUNTER — Ambulatory Visit (HOSPITAL_COMMUNITY)
Admission: RE | Admit: 2014-02-12 | Discharge: 2014-02-12 | Disposition: A | Discharge status: Home / Self Care | Payer: 59 | Source: Ambulatory Visit | Attending: Orthopedic Surgery | Admitting: Orthopedic Surgery

## 2014-02-12 DIAGNOSIS — M25661 Stiffness of right knee, not elsewhere classified: Secondary | ICD-10-CM

## 2014-02-12 DIAGNOSIS — M1711 Unilateral primary osteoarthritis, right knee: Secondary | ICD-10-CM

## 2014-02-12 DIAGNOSIS — Z471 Aftercare following joint replacement surgery: Secondary | ICD-10-CM | POA: Diagnosis not present

## 2014-02-12 DIAGNOSIS — R29898 Other symptoms and signs involving the musculoskeletal system: Secondary | ICD-10-CM

## 2014-02-12 NOTE — Therapy (Signed)
[facility] [address] [place], [place], [address] Phone: [phone]   Fax:  [phone]  Physical Therapy Treatment  Patient Details  Name: Brandon KettleRobert F Villarreal MRN: [id number] Date of Birth: 07/02/1950 Referring Provider:  Dannielle HuhLucey, Steve, MD  Encounter Date: 02/12/2014      PT End of Session - 02/12/14 1729    Visit Number 3   Number of Visits 12   Date for PT Re-Evaluation 03/09/14   Authorization Type UHC   Authorization - Visit Number 3   Authorization - Number of Visits 12   PT Start Time 1613   PT Stop Time 1652   PT Time Calculation (min) 39 min   Activity Tolerance Patient tolerated treatment well   Behavior During Therapy [facility] for tasks assessed/performed      Past Medical History  Diagnosis Date  . Hypertension   . Hyperlipidemia   . ED (erectile dysfunction)   . Obesity   . Sensory neuropathy   . Microproteinuria   . Venous stasis   . Diabetes mellitus     fasting 90-120  . Arthritis     Past Surgical History  Procedure Laterality Date  . Replacement total knee      left  . Back surgery      lumbar disc  . Hammer toe surgery  02/10/2011    Procedure: HAMMER TOE CORRECTION;  Surgeon: Dallas SchimkeBenjamin Ivan McKinney;  Location: [facility];  Service: Orthopedics;  Laterality: Right;  Percutaneous Flexor Tenotomy third Toe Right Foot  . Joint replacement Left     knee  . Hernia repair      umbilical  . Total knee arthroplasty Right 07/29/2013    Procedure: TOTAL KNEE ARTHROPLASTY;  Surgeon: Dannielle HuhSteve Lucey, MD;  Location: [facility];  Service: Orthopedics;  Laterality: Right;    There were no vitals taken for this visit.  Visit Diagnosis:  Knee stiffness, right  Weakness of right hip  Ankle weakness  Primary osteoarthritis of right knee      Subjective Assessment - 02/12/14 1622    Symptoms Pt stated Rt hip is a little sore today, pt wore knee high compression hose to session today, doesn't know compression degree of hose.      Currently in Pain? No/denies   Pain Descriptors / Indicators Sore          [facility] PT Assessment - 02/12/14 1629    Assessment   Medical Diagnosis weakness s/pp Rt knee replacement.    Onset Date 07/08/13   Next MD Visit Dannielle HuhSteve Lucey 03/04/14   Prior Therapy yes but patient notes low intensity.                   [facility] Adult PT Treatment/Exercise - 02/12/14 1716    Exercises   Exercises Knee/Hip   Knee/Hip Exercises: Stretches   Active Hamstring Stretch 3 reps;30 seconds   Active Hamstring Stretch Limitations 3 way on 14 in step   Gastroc Stretch 3 reps;30 seconds   Gastroc Stretch Limitations slant board   Knee/Hip Exercises: Aerobic   Stationary Bike 8' for ROM   Knee/Hip Exercises: Standing   Heel Raises 2 sets;10 reps   Heel Raises Limitations Toe raises 2set x 10x 1HHA   Lateral Step Up Right;15 reps;Hand Hold: 1;Step Height: 4";Left   Forward Step Up Hand Hold: 1;Step Height: 4";15 reps;Both   Functional Squat 10 reps   Functional Squat Limitations 3D hip excursion  PT Short Term Goals - 02/12/14 1732    PT SHORT TERM GOAL #1   Title Patient will be abel to stand from 18" chair without UE supprt   Status On-going   PT SHORT TERM GOAL #2   Title Patient will be able to perform a lunge to the floor and return to be able to walk with increased stride length   Status On-going   PT SHORT TERM GOAL #3   Title Patient will be able to ambualte up and down 4" stairs withotu UE supprt   Status On-going           PT Long Term Goals - 02/12/14 1733    PT LONG TERM GOAL #1   Title Patient will be abel to stand from 18" chair without UE supprt 5x in <15 seconds indicating patient not at increased risk for falls   PT LONG TERM GOAL #2   Title Patient will be able to single leg stand >3 seconds   PT LONG TERM GOAL #3   Title Patient will be able to ambualte up and down 8" stairs without UE supprt               Plan - 02/12/14  1729    Clinical Impression Statement Pt 13 minutes late for apt. today, unable to complete full POC.  Pt wearing compression hose to sessoin today with slight decreased in Bil LE edema.  Session focus on improving functional strengthening with cueing for proper mechnaics with stair training to reduce compensation and pulling up with UE as well as cueing for proper weight loading with squats.  Added reciprocal bicycle to improve ROM.  No reports of increased pain through session.     PT Next Visit Plan Continue with current PT POC including: Squats, heel/toe raises, lunges, stairs, sumo walk, also introduce LE stretches:         Problem List Patient Active Problem List   Diagnosis Date Noted  . S/P total knee arthroplasty 07/29/2013  . Essential hypertension, benign 06/28/2012  . Diabetes 06/28/2012  . Hyperlipidemia LDL goal <100 06/28/2012  . Erectile dysfunction 06/28/2012  . Venous stasis 06/28/2012  . 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 Sensory neuropathy 06/28/2012   Becky Sax, LPTA [phone]  Juel Burrow 02/12/2014, 5:33 PM  [facility] [address] [place], [place], [address] Phone: [phone]   Fax:  [phone]

## 2014-02-14 ENCOUNTER — Ambulatory Visit (HOSPITAL_COMMUNITY)
Admission: RE | Admit: 2014-02-14 | Discharge: 2014-02-14 | Disposition: A | Discharge status: Home / Self Care | Payer: 59 | Source: Ambulatory Visit | Attending: Orthopedic Surgery | Admitting: Orthopedic Surgery

## 2014-02-14 DIAGNOSIS — Z471 Aftercare following joint replacement surgery: Secondary | ICD-10-CM | POA: Diagnosis not present

## 2014-02-14 DIAGNOSIS — M1711 Unilateral primary osteoarthritis, right knee: Secondary | ICD-10-CM

## 2014-02-14 DIAGNOSIS — M25661 Stiffness of right knee, not elsewhere classified: Secondary | ICD-10-CM

## 2014-02-14 DIAGNOSIS — R29898 Other symptoms and signs involving the musculoskeletal system: Secondary | ICD-10-CM

## 2014-02-14 NOTE — Therapy (Signed)
[facility] [address] [place], [place], [address] Phone: [phone]   Fax:  [phone]  Physical Therapy Treatment  Patient Details  Name: Brandon Villarreal MRN: [id number] Date of Birth: 1950/04/25 Referring Provider:  Dannielle Huh, MD  Encounter Date: 02/14/2014      PT End of Session - 02/14/14 1727    Visit Number 4   Number of Visits 12   Date for PT Re-Evaluation 03/09/14   Authorization Type UHC   Authorization - Visit Number 4   Authorization - Number of Visits 12   PT Start Time 1645   PT Stop Time 1730   PT Time Calculation (min) 45 min   Activity Tolerance Patient tolerated treatment well   Behavior During Therapy [facility] for tasks assessed/performed      Past Medical History  Diagnosis Date  . Hypertension   . Hyperlipidemia   . ED (erectile dysfunction)   . Obesity   . Sensory neuropathy   . Microproteinuria   . Venous stasis   . Diabetes mellitus     fasting 90-120  . Arthritis     Past Surgical History  Procedure Laterality Date  . Replacement total knee      left  . Back surgery      lumbar disc  . Hammer toe surgery  02/10/2011    Procedure: HAMMER TOE CORRECTION;  Surgeon: Dallas Schimke;  Location: [facility];  Service: Orthopedics;  Laterality: Right;  Percutaneous Flexor Tenotomy third Toe Right Foot  . Joint replacement Left     knee  . Hernia repair      umbilical  . Total knee arthroplasty Right 07/29/2013    Procedure: TOTAL KNEE ARTHROPLASTY;  Surgeon: Dannielle Huh, MD;  Location: [facility];  Service: Orthopedics;  Laterality: Right;    There were no vitals taken for this visit.  Visit Diagnosis:  Knee stiffness, right  Weakness of right hip  Ankle weakness  Primary osteoarthritis of right knee      Subjective Assessment - 02/14/14 1648    Currently in Pain? Yes   Pain Score 3    Pain Location Knee   Pain Orientation Left  Right knee no longer hurts   Pain Descriptors /  Indicators Sore            [facility] Adult PT Treatment/Exercise - 02/14/14 0001    Knee/Hip Exercises: Stretches   Active Hamstring Stretch 1 rep;20 seconds   Active Hamstring Stretch Limitations 3 way on 8 in step   Hip Flexor Stretch 20 seconds;1 rep   Hip Flexor Stretch Limitations 3 way to 14" box   Gastroc Stretch 3 reps;30 seconds   Gastroc Stretch Limitations slant board   Knee/Hip Exercises: Standing   Heel Raises 2 sets;10 reps   Heel Raises Limitations Toe raises 2set x 10x 1HHA   Forward Lunges Both;10 reps   Side Lunges Both;10 reps   Lateral Step Up Right;15 reps;Hand Hold: 1;Step Height: 4";Left   Forward Step Up Hand Hold: 1;Step Height: 4";15 reps;Both   Functional Squat Limitations 3D hip excursion 10x   Other Standing Knee Exercises Sumo walking 1RT   Other Standing Knee Exercises squats to 20" table 10x            PT Short Term Goals - 02/12/14 1732    PT SHORT TERM GOAL #1   Title Patient will be abel to stand from 18" chair without UE supprt   Status On-going  PT SHORT TERM GOAL #2   Title Patient will be able to perform a lunge to the floor and return to be able to walk with increased stride length   Status On-going   PT SHORT TERM GOAL #3   Title Patient will be able to ambualte up and down 4" stairs withotu UE supprt   Status On-going           PT Long Term Goals - 02/12/14 1733    PT LONG TERM GOAL #1   Title Patient will be abel to stand from 18" chair without UE supprt 5x in <15 seconds indicating patient not at increased risk for falls   PT LONG TERM GOAL #2   Title Patient will be able to single leg stand >3 seconds   PT LONG TERM GOAL #3   Title Patient will be able to ambualte up and down 8" stairs without UE supprt               Plan - 02/14/14 1729    Clinical Impression Statement Patient displays improvign pain with improved Rt knee pain to zero though he notes Left knee hurts a little. Patient states 100% adhearance to  HEP. with performance to HEP. Patient perfomed all exercises with good performance demonstrating readiness for progression of side step ups,    PT Next Visit Plan progress squat training to the chair,         Problem List Patient Active Problem List   Diagnosis Date Noted  . S/P total knee arthroplasty 07/29/2013  . Essential hypertension, benign 06/28/2012  . Diabetes 06/28/2012  . Hyperlipidemia LDL goal <100 06/28/2012  . Erectile dysfunction 06/28/2012  . Venous stasis 06/28/2012  . 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 Sensory neuropathy 06/28/2012    Jerilee FieldeWitt, Dorothia Passmore R 02/14/2014, 5:44 PM  [facility] [address] [place], [place], [address] Phone: [phone]   Fax:  [phone]

## 2014-02-18 ENCOUNTER — Ambulatory Visit (HOSPITAL_COMMUNITY)
Admission: RE | Admit: 2014-02-18 | Discharge: 2014-02-18 | Disposition: A | Discharge status: Home / Self Care | Payer: 59 | Source: Ambulatory Visit | Attending: Orthopedic Surgery | Admitting: Orthopedic Surgery

## 2014-02-18 DIAGNOSIS — M25661 Stiffness of right knee, not elsewhere classified: Secondary | ICD-10-CM

## 2014-02-18 DIAGNOSIS — R29898 Other symptoms and signs involving the musculoskeletal system: Secondary | ICD-10-CM

## 2014-02-18 DIAGNOSIS — M1711 Unilateral primary osteoarthritis, right knee: Secondary | ICD-10-CM

## 2014-02-18 DIAGNOSIS — Z471 Aftercare following joint replacement surgery: Secondary | ICD-10-CM | POA: Diagnosis not present

## 2014-02-18 NOTE — Therapy (Signed)
[facility] [address] [place], [place], [address] Phone: [phone]   Fax:  [phone]  Physical Therapy Treatment  Patient Details  Name: Brandon Villarreal MRN: [id number] Date of Birth: 09/06/1950 Referring Provider:  Dannielle HuhLucey, Steve, MD  Encounter Date: 02/18/2014      PT End of Session - 02/18/14 1729    Visit Number 5   Number of Visits 12   Date for PT Re-Evaluation 03/09/14   Authorization Type UHC   Authorization - Visit Number 5   Authorization - Number of Visits 12   PT Start Time 1651   PT Stop Time 1730   PT Time Calculation (min) 39 min   Activity Tolerance Patient tolerated treatment well   Behavior During Therapy [facility] for tasks assessed/performed      Past Medical History  Diagnosis Date  . Hypertension   . Hyperlipidemia   . ED (erectile dysfunction)   . Obesity   . Sensory neuropathy   . Microproteinuria   . Venous stasis   . Diabetes mellitus     fasting 90-120  . Arthritis     Past Surgical History  Procedure Laterality Date  . Replacement total knee      left  . Back surgery      lumbar disc  . Hammer toe surgery  02/10/2011    Procedure: HAMMER TOE CORRECTION;  Surgeon: Dallas SchimkeBenjamin Ivan McKinney;  Location: [facility];  Service: Orthopedics;  Laterality: Right;  Percutaneous Flexor Tenotomy third Toe Right Foot  . Joint replacement Left     knee  . Hernia repair      umbilical  . Total knee arthroplasty Right 07/29/2013    Procedure: TOTAL KNEE ARTHROPLASTY;  Surgeon: Dannielle HuhSteve Lucey, MD;  Location: [facility];  Service: Orthopedics;  Laterality: Right;    There were no vitals taken for this visit.  Visit Diagnosis:  Knee stiffness, right  Weakness of right hip  Ankle weakness  Primary osteoarthritis of right knee      Subjective Assessment - 02/18/14 1653    Symptoms Pt reprots mild complaints of pain today, more generalized soreness from work today.     Currently in Pain? Yes   Pain Score 2   Lt  2/10, Rt 1/0   Pain Location Knee   Pain Orientation Left          [facility] PT Assessment - 02/18/14 0001    Assessment   Medical Diagnosis weakness s/p Rt knee replacement.    Onset Date 07/08/13   Next MD Visit Dannielle HuhSteve Lucey 03/04/14                  [facility] Treatment/Exercise - 02/18/14 1651    Exercises   Exercises Knee/Hip   Knee/Hip Exercises: Stretches   Active Hamstring Stretch 2 reps;20 seconds   Active Hamstring Stretch Limitations 3 Way, 14" step   Quad Stretch 1 rep;30 seconds   Quad Stretch Limitations manual stretch   Hip Flexor Stretch 2 reps;20 seconds   Hip Flexor Stretch Limitations 3 way to 14" box   Gastroc Stretch 3 reps;30 seconds   Gastroc Stretch Limitations slant board   Knee/Hip Exercises: Standing   Forward Lunges Both   Forward Lunges Limitations x12   Side Lunges Both   Side Lunges Limitations x12   Functional Squat 15 reps   Stairs 4", reciprocal gait with 1 HHA x3   Other Standing Knee Exercises Sumo walking GTB, 1RT  Knee/Hip Exercises: Seated   Other Seated Knee Exercises STS x5 without UE                  PT Short Term Goals - 02/18/14 1718    PT SHORT TERM GOAL #1   Title Patient will be abel to stand from 18" chair without UE supprt   Baseline 02/18/14 : Able to transfer sit - stand without use of UE   Status Achieved   PT SHORT TERM GOAL #2   Title Patient will be able to perform a lunge to the floor and return to be able to walk with increased stride length   Baseline 02/18/14 : Able to lunge with good stride length, though unable to lunge to floor   Status On-going   PT SHORT TERM GOAL #3   Title Patient will be able to ambulate up and down 4" stairs without UE supprt   Baseline 02/18/14 : Able to ascend/descend stairs with reciprocal gait with 1 HHA   Status On-going           PT Long Term Goals - 02/12/14 1733    PT LONG TERM GOAL #1   Title Patient will be abel to stand from 18" chair without UE  supprt 5x in <15 seconds indicating patient not at increased risk for falls   PT LONG TERM GOAL #2   Title Patient will be able to single leg stand >3 seconds   PT LONG TERM GOAL #3   Title Patient will be able to ambualte up and down 8" stairs without UE supprt               Plan - 02/18/14 1730    Clinical Impression Statement Assessed STGs, with pt meeting STG #1 and partly meeting STG #2 and 3!!   Pt reported mild complaints of pain in Lt > Rt knee today, related to compensations to decrease use of Rt LE with activities.    Added quad stretch at end of treatment session for (B) LE to decrease quqad tightness and muscle fatigue.     Pt will benefit from skilled therapeutic intervention in order to improve on the following deficits Abnormal gait;Decreased endurance;Improper body mechanics;Decreased activity tolerance;Decreased strength;Impaired flexibility;Difficulty walking;Decreased balance;Decreased mobility;Decreased range of motion;Pain   Rehab Potential Good   PT Frequency 3x / week   PT Duration 4 weeks   PT Treatment/Interventions Gait training   PT Next Visit Plan progress squat training to the chair        Problem List Patient Active Problem List   Diagnosis Date Noted  . S/P total knee arthroplasty 07/29/2013  . Essential hypertension, benign 06/28/2012  . Diabetes 06/28/2012  . Hyperlipidemia LDL goal <100 06/28/2012  . Erectile dysfunction 06/28/2012  . Venous stasis 06/28/2012  . 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 Sensory neuropathy 06/28/2012    Kellie Shropshire, DPT [phone]   02/18/2014, 5:31 PM  [facility] [address] [place], [place], [address] Phone: [phone]   Fax:  [phone]

## 2014-02-19 ENCOUNTER — Ambulatory Visit (HOSPITAL_COMMUNITY)
Admission: RE | Admit: 2014-02-19 | Discharge: 2014-02-19 | Disposition: A | Discharge status: Home / Self Care | Payer: 59 | Source: Ambulatory Visit | Attending: Orthopedic Surgery | Admitting: Orthopedic Surgery

## 2014-02-19 DIAGNOSIS — Z471 Aftercare following joint replacement surgery: Secondary | ICD-10-CM | POA: Diagnosis not present

## 2014-02-19 DIAGNOSIS — R29898 Other symptoms and signs involving the musculoskeletal system: Secondary | ICD-10-CM

## 2014-02-19 DIAGNOSIS — M25661 Stiffness of right knee, not elsewhere classified: Secondary | ICD-10-CM

## 2014-02-19 DIAGNOSIS — M1711 Unilateral primary osteoarthritis, right knee: Secondary | ICD-10-CM

## 2014-02-19 NOTE — Therapy (Signed)
[name] [address] [place], [place], [address] Phone: [phone]   Fax:  [phone]  Physical Therapy Treatment  Patient Details  Name: Brandon Villarreal MRN: [id number] Date of Birth: 22-Oct-1950 Referring Provider:  Dannielle Huh, MD  Encounter Date: 02/19/2014      PT End of Session - 02/19/14 1612    Visit Number 6   Number of Visits 12   Date for PT Re-Evaluation 03/09/14   Authorization Type U[employer]   Authorization - Visit Number 6   Authorization - Number of Visits 12   PT Start Time 1555   PT Stop Time 1650   PT Time Calculation (min) 55 min   Activity Tolerance Patient tolerated treatment well   Behavior During Therapy [facility] for tasks assessed/performed      Past Medical History  Diagnosis Date  . Hypertension   . Hyperlipidemia   . ED (erectile dysfunction)   . Obesity   . Sensory neuropathy   . Microproteinuria   . Venous stasis   . Diabetes mellitus     fasting 90-120  . Arthritis     Past Surgical History  Procedure Laterality Date  . Replacement total knee      left  . Back surgery      lumbar disc  . Hammer toe surgery  02/10/2011    Procedure: HAMMER TOE CORRECTION;  Surgeon: Brandon Villarreal;  Location: [facility];  Service: Orthopedics;  Laterality: Right;  Percutaneous Flexor Tenotomy third Toe Right Foot  . Joint replacement Left     knee  . Hernia repair      umbilical  . Total knee arthroplasty Right 07/29/2013    Procedure: TOTAL KNEE ARTHROPLASTY;  Surgeon: Brandon Huh, MD;  Location: [facility];  Service: Orthopedics;  Laterality: Right;    There were no vitals taken for this visit.  Visit Diagnosis:  Knee stiffness, right  Weakness of right hip  Ankle weakness  Primary osteoarthritis of right knee      Subjective Assessment - 02/19/14 1601    Symptoms Pt stated he has noticed alot of improvements since began therapy.  Currently most difficulty with sit to standing.  Pain minimal today,  1/10 sore and stiff   Currently in Pain? Yes   Pain Score 1    Pain Location Knee   Pain Orientation Left   Pain Descriptors / Indicators Sore                    [facility] Adult PT Treatment/Exercise - 02/19/14 1614    Exercises   Exercises Knee/Hip   Knee/Hip Exercises: Stretches   Active Hamstring Stretch 3 reps;30 seconds   Active Hamstring Stretch Limitations 3 Way, 14" step   Quad Stretch 2 reps;30 seconds   Quad Stretch Limitations manual   Knee: Self-Stretch Limitations 10 x3" on 8in step for improve flexion 3way   Gastroc Stretch 3 reps;30 seconds   Gastroc Stretch Limitations slant board   Knee/Hip Exercises: Standing   Forward Lunges Both;15 reps   Side Lunges Both   Side Lunges Limitations x12   Lateral Step Up Right;15 reps;Hand Hold: 1;Step Height: 6"   Forward Step Up Right;15 reps;Hand Hold: 1;Step Height: 6"   Step Down 5 reps;Hand Hold: 1;Step Height: 2"   Step Down Limitations toe touching 5RT in sagital and frontal place   Functional Squat 15 reps   Functional Squat Limitations 3D hip excursion 10x   Stairs 3RT 4"  reciprocal pattern with 1 HHA   Other Standing Knee Exercises squats to 20" chair 15x   Knee/Hip Exercises: Seated   Other Seated Knee Exercises STS 10x with arms crossed across crest from 20 in, trial from 17in- unable without HHA                  PT Short Term Goals - 02/19/14 1612    PT SHORT TERM GOAL #1   Title Patient will be abel to stand from 18" chair without UE supprt   Status Achieved   PT SHORT TERM GOAL #2   Title Patient will be able to perform a lunge to the floor and return to be able to walk with increased stride length   Status On-going   PT SHORT TERM GOAL #3   Title Patient will be able to ambulate up and down 4" stairs without UE supprt   Status On-going           PT Long Term Goals - 02/19/14 1614    PT LONG TERM GOAL #1   Title Patient will be abel to stand from 18" chair without UE supprt 5x  in <15 seconds indicating patient not at increased risk for falls   Status On-going   PT LONG TERM GOAL #2   Title Patient will be able to single leg stand >3 seconds   PT LONG TERM GOAL #3   Title Patient will be able to ambualte up and down 8" stairs without UE supprt               Plan - 02/19/14 1648    Clinical Impression Statement Progressed gluteal strenghtening for functinal strengtheining with squats down to a 20in chair.  Attempted sitting on 17 in step, pt unable without Bil HHA. Continued stair training for functional strengthening, trial with balance reach from 2in step for eccentric quad control to improve descending stairs.  Pt able to demonstrate reciprocal pattern on 4 in with curing to reduce compensation .Continued stretches to improve LE flexibilty.  Pt wearing compression hose through treatment, still continues to have increased swelling Bil LE.  Pt educated on manual techniques to assist wtih excess fluids in LE.  No reports of increased pain thrrough session, pt was limited by fatigue.     PT Next Visit Plan Cotninue with current PT POC to improve functional strengthening with sit to stand and stairs.          Problem List Patient Active Problem List   Diagnosis Date Noted  . S/P total knee arthroplasty 07/29/2013  . Essential hypertension, benign 06/28/2012  . Diabetes 06/28/2012  . Hyperlipidemia LDL goal <100 06/28/2012  . Erectile dysfunction 06/28/2012  . Venous stasis 06/28/2012  . 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 Sensory neuropathy 06/28/2012   Brandon Villarreal, LPTA [phone]  [name], Brandon Jo 02/19/2014, 5:33 PM  [name] [address] [place], [place], [address] Phone: [phone]   Fax:  [phone]

## 2014-02-20 ENCOUNTER — Telehealth: Payer: Self-pay | Admitting: Family Medicine

## 2014-02-20 ENCOUNTER — Other Ambulatory Visit: Payer: Self-pay | Admitting: *Deleted

## 2014-02-20 DIAGNOSIS — E119 Type 2 diabetes mellitus without complications: Secondary | ICD-10-CM

## 2014-02-20 DIAGNOSIS — Z79899 Other long term (current) drug therapy: Secondary | ICD-10-CM

## 2014-02-20 DIAGNOSIS — E785 Hyperlipidemia, unspecified: Secondary | ICD-10-CM

## 2014-02-20 DIAGNOSIS — Z125 Encounter for screening for malignant neoplasm of prostate: Secondary | ICD-10-CM

## 2014-02-20 NOTE — Telephone Encounter (Signed)
Pt is requesting labs to be sent over for his feb 1 appt.

## 2014-02-20 NOTE — Telephone Encounter (Signed)
a1c - 01/02/14 Lipid, liver, bmp, psa, microalb urine - 05/11/13

## 2014-02-20 NOTE — Telephone Encounter (Signed)
Left message notifying pt that orders are ready.

## 2014-02-20 NOTE — Telephone Encounter (Signed)
Rep al of April's labs

## 2014-02-21 ENCOUNTER — Ambulatory Visit (HOSPITAL_COMMUNITY): Payer: 59

## 2014-02-25 ENCOUNTER — Ambulatory Visit (HOSPITAL_COMMUNITY)
Admission: RE | Admit: 2014-02-25 | Discharge: 2014-02-25 | Disposition: A | Discharge status: Home / Self Care | Payer: 59 | Source: Ambulatory Visit | Attending: Orthopedic Surgery | Admitting: Orthopedic Surgery

## 2014-02-25 DIAGNOSIS — M25661 Stiffness of right knee, not elsewhere classified: Secondary | ICD-10-CM

## 2014-02-25 DIAGNOSIS — M1711 Unilateral primary osteoarthritis, right knee: Secondary | ICD-10-CM

## 2014-02-25 DIAGNOSIS — Z471 Aftercare following joint replacement surgery: Secondary | ICD-10-CM | POA: Diagnosis not present

## 2014-02-25 DIAGNOSIS — R29898 Other symptoms and signs involving the musculoskeletal system: Secondary | ICD-10-CM

## 2014-02-25 NOTE — Therapy (Signed)
[facility] [address] [place], [place], [address] Phone: [phone]   Fax:  [phone]  Physical Therapy Treatment  Patient Details  Name: Brandon Villarreal MRN: [id number] Date of Birth: [birth year] Referring Provider:  Dannielle Huh, MD  Encounter Date: 02/25/2014      PT End of Session - 02/25/14 1650    Visit Number 7   Number of Visits 12   Date for PT Re-Evaluation 03/09/14   Authorization Type UHC   Authorization - Visit Number 7   Authorization - Number of Visits 12   PT Start Time 1557   PT Stop Time 1645   PT Time Calculation (min) 48 min   Activity Tolerance Patient tolerated treatment well   Behavior During Therapy [facility] for tasks assessed/performed      Past Medical History  Diagnosis Date  . Hypertension   . Hyperlipidemia   . ED (erectile dysfunction)   . Obesity   . Sensory neuropathy   . Microproteinuria   . Venous stasis   . Diabetes mellitus     fasting 90-120  . Arthritis     Past Surgical History  Procedure Laterality Date  . Replacement total knee      left  . Back surgery      lumbar disc  . Hammer toe surgery  02/10/2011    Procedure: HAMMER TOE CORRECTION;  Surgeon: Dallas Schimke;  Location: [facility];  Service: Orthopedics;  Laterality: Right;  Percutaneous Flexor Tenotomy third Toe Right Foot  . Joint replacement Left     knee  . Hernia repair      umbilical  . Total knee arthroplasty Right 07/29/2013    Procedure: TOTAL KNEE ARTHROPLASTY;  Surgeon: Dannielle Huh, MD;  Location: [facility];  Service: Orthopedics;  Laterality: Right;    There were no vitals taken for this visit.  Visit Diagnosis:  Knee stiffness, right  Weakness of right hip  Ankle weakness  Primary osteoarthritis of right knee      Subjective Assessment - 02/25/14 1612    Symptoms No complaints of pain today.    Currently in Pain? No/denies          [facility] PT Assessment - 02/25/14 0001    Assessment   Medical Diagnosis weakness s/p Rt knee replacement.    Onset Date 07/08/13   Next MD Visit Dannielle Huh 03/04/14   Flexibility   Soft Tissue Assessment /Muscle Lenght yes   Quadriceps Rt 13 1/4 in, Lt 19 1/2 in                  [facility] Adult PT Treatment/Exercise - 02/25/14 0001    Exercises   Exercises Knee/Hip   Knee/Hip Exercises: Stretches   Active Hamstring Stretch 3 reps;20 seconds   Active Hamstring Stretch Limitations 3 Way, 14" step   Quad Stretch 2 reps;30 seconds   Quad Stretch Limitations Prone with rope   Knee: Self-Stretch Limitations 10 x3" on 8in step for improve flexion 3way   Gastroc Stretch 3 reps;30 seconds   Gastroc Stretch Limitations slant board   Knee/Hip Exercises: Standing   Heel Raises 10 reps   Heel Raises Limitations with squat and red ball tap to 14" box   Knee Flexion 2 sets;10 reps   Knee Flexion Limitations 4#   Forward Lunges Both;15 reps   Side Lunges Both;15 reps   Lateral Step Up 10 reps;Hand Hold: 2;Right   Lateral Step Up Limitations 7" (1st stair)  Forward Step Up 10 reps;Hand Hold: 1;Both   Forward Step Up Limitations 7" (1st stair)   SLS with Vectors Tandem Rt 10.37", Lt 20.31"   Other Standing Knee Exercises Hip EXT/ABD 4# 2x10   Knee/Hip Exercises: Seated   Other Seated Knee Exercises STS 2x5, with no UE eccentric lowering for 5"                  PT Short Term Goals - 02/19/14 1612    PT SHORT TERM GOAL #1   Title Patient will be abel to stand from 18" chair without UE supprt   Status Achieved   PT SHORT TERM GOAL #2   Title Patient will be able to perform a lunge to the floor and return to be able to walk with increased stride length   Status On-going   PT SHORT TERM GOAL #3   Title Patient will be able to ambulate up and down 4" stairs without UE supprt   Status On-going           PT Long Term Goals - 02/19/14 1614    PT LONG TERM GOAL #1   Title Patient will be abel to stand from 18" chair without UE  supprt 5x in <15 seconds indicating patient not at increased risk for falls   Status On-going   PT LONG TERM GOAL #2   Title Patient will be able to single leg stand >3 seconds   PT LONG TERM GOAL #3   Title Patient will be able to ambualte up and down 8" stairs without UE supprt               Plan - 02/25/14 1651    Clinical Impression Statement Focused treatment session on fucntional strengthening on stair height steps and with sit-stand exercise.  Pt able to stand without use of UE, though on ocassion pt did rely on momentum to stand; added eccentric lowering for slow descent to chair to increase quad eccentric control with pt being able to lower for 5 seconds on 6 out of 10 trials.  Assessed quad length on Rt compared to Lt side after prone stretch, with better mobility noted on most recent surgical knee (Rt side).  Also, initiated balance activities with tandem stance, as pt reports difficulty with balance and unable to attain SLS today.     Pt will benefit from skilled therapeutic intervention in order to improve on the following deficits Abnormal gait;Decreased endurance;Improper body mechanics;Decreased activity tolerance;Decreased strength;Impaired flexibility;Difficulty walking;Decreased balance;Decreased mobility;Decreased range of motion;Pain   Rehab Potential Good   PT Frequency 3x / week   PT Duration 4 weeks   PT Next Visit Plan Cotninue with current PT POC to improve functional strengthening with sit to stand and stairs, incorporating balance activites as tolerated.          Problem List Patient Active Problem List   Diagnosis Date Noted  . S/P total knee arthroplasty 07/29/2013  . Essential hypertension, benign 06/28/2012  . Diabetes 06/28/2012  . Hyperlipidemia LDL goal <100 06/28/2012  . Erectile dysfunction 06/28/2012  . Venous stasis 06/28/2012  . 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 Sensory neuropathy  06/28/2012    Kellie ShropshireStephanie Kyara Boxer, DPT [phone]  02/25/2014, 4:59 PM  [facility] [address] [place], [place], [address] Phone: [phone]   Fax:  [phone]

## 2014-02-26 ENCOUNTER — Telehealth: Payer: Self-pay | Admitting: Family Medicine

## 2014-02-26 ENCOUNTER — Ambulatory Visit (HOSPITAL_COMMUNITY)
Admission: RE | Admit: 2014-02-26 | Discharge: 2014-02-26 | Disposition: A | Discharge status: Home / Self Care | Payer: 59 | Source: Ambulatory Visit | Attending: Orthopedic Surgery | Admitting: Orthopedic Surgery

## 2014-02-26 ENCOUNTER — Other Ambulatory Visit: Payer: Self-pay | Admitting: *Deleted

## 2014-02-26 DIAGNOSIS — Z471 Aftercare following joint replacement surgery: Secondary | ICD-10-CM | POA: Diagnosis not present

## 2014-02-26 DIAGNOSIS — M1711 Unilateral primary osteoarthritis, right knee: Secondary | ICD-10-CM

## 2014-02-26 DIAGNOSIS — R29898 Other symptoms and signs involving the musculoskeletal system: Secondary | ICD-10-CM

## 2014-02-26 DIAGNOSIS — M25661 Stiffness of right knee, not elsewhere classified: Secondary | ICD-10-CM

## 2014-02-26 MED ORDER — BLOOD GLUCOSE MONITOR KIT: PACK | Status: AC

## 2014-02-26 NOTE — Therapy (Signed)
[facility] [address] [place], [place], [address] Phone: [phone]   Fax:  [phone]  Physical Therapy Treatment  Patient Details  Name: Brandon Villarreal MRN: [id number] Date of Birth: 04/03/1950 Referring Provider:  Dannielle HuhLucey, Steve, MD  Encounter Date: 02/26/2014      PT End of Session - 02/26/14 1706    Visit Number 8   Number of Visits 12   Date for PT Re-Evaluation 03/09/14   Authorization Type UHC   Authorization - Visit Number 8   Authorization - Number of Visits 12   PT Start Time 1642   PT Stop Time 1735   PT Time Calculation (min) 53 min   Activity Tolerance Patient tolerated treatment well   Behavior During [facility] for tasks assessed/performed      Past Medical History  Diagnosis Date  . Hypertension   . Hyperlipidemia   . ED (erectile dysfunction)   . Obesity   . Sensory neuropathy   . Microproteinuria   . Venous stasis   . Diabetes mellitus     fasting 90-120  . Arthritis     Past Surgical History  Procedure Laterality Date  . Replacement total knee      left  . Back surgery      lumbar disc  . Hammer toe surgery  02/10/2011    Procedure: HAMMER TOE CORRECTION;  Surgeon: Dallas SchimkeBenjamin Ivan McKinney;  Location: [facility];  Service: Orthopedics;  Laterality: Right;  Percutaneous Flexor Tenotomy third Toe Right Foot  . Joint replacement Left     knee  . Hernia repair      umbilical  . Total knee arthroplasty Right 07/29/2013    Procedure: TOTAL KNEE ARTHROPLASTY;  Surgeon: Dannielle HuhSteve Lucey, MD;  Location: [facility];  Service: Orthopedics;  Laterality: Right;    There were no vitals taken for this visit.  Visit Diagnosis:  Knee stiffness, right  Weakness of right hip  Ankle weakness  Primary osteoarthritis of right knee      Subjective Assessment - 02/26/14 1645    Symptoms Pain free, compliant with exercises at home and while at work.  Pt stated most difficulty currently with sit to stands from chair   Currently in Pain? No/denies                    [facility]/Exercise - 02/26/14 1646    Exercises   Exercises Knee/Hip   Knee/Hip Exercises: Stretches   Active Hamstring Stretch 3 reps;20 seconds   Active Hamstring Stretch Limitations 3 Way, 14" step   Gastroc Stretch 3 reps;30 seconds   Gastroc Stretch Limitations slant board   Knee/Hip Exercises: Standing   Knee Flexion Both;15 reps   Knee Flexion Limitations 5   Forward Lunges Both;15 reps   Forward Lunges Limitations 4in step no HHA for balance   Side Lunges Both;15 reps   Side Lunges Limitations 4in step no HHA for balance   Lateral Step Up Both;15 reps;Hand Hold: 2   Lateral Step Up Limitations 7" (1st stair)   Forward Step Up Both;15 reps;Hand Hold: 2   Forward Step Up Limitations 7" (1st stair)   Functional Squat 5 reps   Functional Squat Limitations squat matrix    Stairs 5RT ascending 7in and descending 4in 2HR, cueing to reduce HHA   SLS with Vectors Tandem gait 1 RT with 10" holds and min assist   Knee/Hip Exercises: Seated   Other Seated Knee Exercises 410  STS no HHA, eccentric lowering for 5"                  PT Short Term Goals - 02/26/14 1752    PT SHORT TERM GOAL #1   Title Patient will be abel to stand from 18" chair without UE supprt   Status Achieved   PT SHORT TERM GOAL #2   Title Patient will be able to perform a lunge to the floor and return to be able to walk with increased stride length   Status On-going   PT SHORT TERM GOAL #3   Title Patient will be able to ambulate up and down 4" stairs without UE supprt   Status On-going           PT Long Term Goals - 02/26/14 1752    PT LONG TERM GOAL #1   Title Patient will be abel to stand from 18" chair without UE supprt 5x in <15 seconds indicating patient not at increased risk for falls   Status On-going   PT LONG TERM GOAL #2   Title Patient will be able to single leg stand >3 seconds   Status On-going   PT  LONG TERM GOAL #3   Title Patient will be able to ambualte up and down 8" stairs without UE supprt   Status On-going               Plan - 02/26/14 1709    Clinical Impression Statement Session focus on improving functional strengthening especially with sit to stand and stair training.  Began squat matrix for gluteal strengthening to increase ease with sit to stand.  Pt able to stand without HHA through on occassion pt did rely on momentum and hands out in front to assist with balance.   Progressed to tandem gait with 10" holds to improve stability with min assistance required and cueing for spatial awareness to improve balance to reduce risk of falls.  Pt limited by fatigue with new activities.     PT Next Visit Plan Complete MD progress note next session prior apt 03/04/2014.  Cotninue with current PT POC to improve functional strengthening with sit to stand and stairs, incorporating balance activites as tolerated.          Problem List Patient Active Problem List   Diagnosis Date Noted  . S/P total knee arthroplasty 07/29/2013  . Essential hypertension, benign 06/28/2012  . Diabetes 06/28/2012  . Hyperlipidemia LDL goal <100 06/28/2012  . Erectile dysfunction 06/28/2012  . Venous stasis 06/28/2012  . 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 Sensory neuropathy 06/28/2012   Becky Sax, LPTA [phone]   Juel Burrow 02/26/2014, 6:04 PM   [facility] [address] [place], [place], [address] Phone: [phone]   Fax:  [phone]

## 2014-02-27 NOTE — Telephone Encounter (Signed)
Error

## 2014-02-28 ENCOUNTER — Ambulatory Visit (HOSPITAL_COMMUNITY)
Admission: RE | Admit: 2014-02-28 | Discharge: 2014-02-28 | Disposition: A | Discharge status: Home / Self Care | Payer: 59 | Source: Ambulatory Visit | Attending: Orthopedic Surgery | Admitting: Orthopedic Surgery

## 2014-02-28 DIAGNOSIS — Z471 Aftercare following joint replacement surgery: Secondary | ICD-10-CM | POA: Diagnosis not present

## 2014-02-28 DIAGNOSIS — M1711 Unilateral primary osteoarthritis, right knee: Secondary | ICD-10-CM

## 2014-02-28 DIAGNOSIS — R29898 Other symptoms and signs involving the musculoskeletal system: Secondary | ICD-10-CM

## 2014-02-28 DIAGNOSIS — M25661 Stiffness of right knee, not elsewhere classified: Secondary | ICD-10-CM

## 2014-02-28 NOTE — Therapy (Signed)
[facility] [address] [place], [place], [address] Phone: [phone]   Fax:  [phone]  Physical Therapy Treatment  Patient Details  Name: Brandon Villarreal MRN: [id number] Date of Birth: May 30, 1950 Referring Provider:  Dannielle Huh, MD  Encounter Date: 02/28/2014      PT End of Session - 02/28/14 1559    Visit Number 9   Number of Visits 12   Date for PT Re-Evaluation 03/09/14   Authorization Type UHC   Authorization - Visit Number 9   Authorization - Number of Visits 12   PT Start Time 1601   PT Stop Time 1645   PT Time Calculation (min) 44 min   Activity Tolerance Patient tolerated treatment well   Behavior During Therapy [facility] for tasks assessed/performed      Past Medical History  Diagnosis Date  . Hypertension   . Hyperlipidemia   . ED (erectile dysfunction)   . Obesity   . Sensory neuropathy   . Microproteinuria   . Venous stasis   . Diabetes mellitus     fasting 90-120  . Arthritis     Past Surgical History  Procedure Laterality Date  . Replacement total knee      left  . Back surgery      lumbar disc  . Hammer toe surgery  02/10/2011    Procedure: HAMMER TOE CORRECTION;  Surgeon: Dallas Schimke;  Location: [facility];  Service: Orthopedics;  Laterality: Right;  Percutaneous Flexor Tenotomy third Toe Right Foot  . Joint replacement Left     knee  . Hernia repair      umbilical  . Total knee arthroplasty Right 07/29/2013    Procedure: TOTAL KNEE ARTHROPLASTY;  Surgeon: Dannielle Huh, MD;  Location: [facility] OR;  Service: Orthopedics;  Laterality: Right;    There were no vitals taken for this visit.  Visit Diagnosis:  Knee stiffness, right  Weakness of right hip  Ankle weakness  Primary osteoarthritis of right knee      Subjective Assessment - 02/28/14 1603    Symptoms Patient notes increased feeling of stiffness since last session. though he does believe he has been feeling better and less painful.    Currently in Pain? No/denies          [facility] PT Assessment - 02/28/14 0001    Assessment   Medical Diagnosis weakness s/p Rt knee replacement.    Onset Date 07/08/13   Next MD Visit Dannielle Huh 03/04/14   Observation/Other Assessments   Focus on Therapeutic Outcomes (FOTO)  31% limited was 62% limited   Sit to Stand   Comments requires UE support, max depth withtou UE support is 19",    Other:   Other/ Comments Stairs: Bilateral UE support for 7" steps. able to perform 4" step without 1 UE support    Other:   Other/Comments Able to single elg balance >10 seconds but unabl to bend knee >10 degrees   AROM   Right Knee Extension 0   Right Knee Flexion 116   Strength   Right Hip Flexion 5/5   Right Hip Extension 3+/5   Right Hip ABduction 3+/5   Left Hip Flexion 5/5   Left Hip ABduction 3+/5   Right Knee Flexion 4+/5   Right Knee Extension 4+/5   Left Knee Flexion 5/5   Left Knee Extension 5/5   Right Ankle Dorsiflexion 5/5   Left Ankle Dorsiflexion 5/5   Flexibility   Soft Tissue Assessment /  Muscle Lenght yes   Quadriceps Rt 14 1/4 in, Lt 19 1/2 in                  [facility] Adult PT Treatment/Exercise - 02/28/14 0001    Knee/Hip Exercises: Stretches   Active Hamstring Stretch 3 reps;20 seconds   Active Hamstring Stretch Limitations 3 Way, 14" step   Quad Stretch 2 reps;30 seconds   Quad Stretch Limitations Prone with rope   Hip Flexor Stretch Limitations 3 way knee driver on 12" box 13Y to increase knee flexion   Gastroc Stretch 3 reps;20 seconds   Gastroc Stretch Limitations slant board   Knee/Hip Exercises: Standing   Forward Lunges Both;15 reps   Forward Lunges Limitations 4in step no HHA for balance   Side Lunges Both;15 reps   Side Lunges Limitations 4in step no HHA for balance   Step Down --   Step Down Limitations --   Functional Squat 5 reps   Functional Squat Limitations SFT squat with 10lb dumbbells with frontal plane neutral.    Other Standing Knee  Exercises Squat 5x to chair 3 sets: neutral and split stance.                   PT Short Term Goals - 02/28/14 1840    PT SHORT TERM GOAL #1   Title Patient will be abel to stand from 18" chair without UE supprt   Baseline 02/18/14 : Able to transfer sit - stand without use of UE   Status Achieved   PT SHORT TERM GOAL #2   Title Patient will be able to perform a lunge to the floor and return to be able to walk with increased stride length   Baseline Able to lunge with good stride length, though unable to lunge to floor   Status On-going   PT SHORT TERM GOAL #3   Title Patient will be able to ambulate up and down 4" stairs without UE supprt   Baseline  Able to ascend/descend stairs with reciprocal gait with 1 HHA   Status On-going           PT Long Term Goals - 02/28/14 1841    PT LONG TERM GOAL #1   Title Patient will be abel to stand from 18" chair without UE supprt 5x in <15 seconds indicating patient not at increased risk for falls   Status On-going   PT LONG TERM GOAL #2   Title Patient will be able to single leg stand >3 seconds   Status Achieved   PT LONG TERM GOAL #3   Title Patient will be able to ambualte up and down 8" stairs without UE supprt   Status On-going               Plan - 02/28/14 1835    Clinical Impression Statement Reassessment performerd thsi session. Patient states only being able to afford therapy for two more session. Patient discplays improvign knee flexion AROm and improved LE strength bilaterally., however patient's knee flexion contineuds to be limitedresulting in difficulty performing sit to stand throgubh full depth restulting in difficulty using public restrooms. Patient is now able to ambualte up 4" steps withtou more than 1 UE assistance, bute requires bilateral UE assistance with 8" steps due to knee stiffness and weakness. The next two session are to focus on increasing patient's knee flexion and subsequent control of knee  flexion during ascending and descending stars and education patient on HEP progression in  anticipation of discharge   PT Next Visit Plan The next two session are to focus on increasing patient's knee flexion and subsequent control of knee flexion during ascending and descending stars and education patient on HEP progression in anticipation of discharge   PT Home Exercise Plan squats, heel raises, lunges, 3D hip excursions. Next session Add quad stretch and 3 way knee drives        Problem List Patient Active Problem List   Diagnosis Date Noted  . S/P total knee arthroplasty 07/29/2013  . Essential hypertension, benign 06/28/2012  . Diabetes 06/28/2012  . Hyperlipidemia LDL goal <100 06/28/2012  . Erectile dysfunction 06/28/2012  . Venous stasis 06/28/2012  . 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 Sensory neuropathy 06/28/2012    Jerilee Fieldash Camrie Stock PT DPT [phone]  [facility] [address] [place], [place], [address] Phone: [phone]   Fax:  [phone]

## 2014-03-02 LAB — HEPATIC FUNCTION PANEL
ALT: 13 U/L (ref 0–53)
AST: 12 U/L (ref 0–37)
Albumin: 4 g/dL (ref 3.5–5.2)
Alkaline Phosphatase: 68 U/L (ref 39–117)
BILIRUBIN INDIRECT: 0.6 mg/dL (ref 0.2–1.2)
Bilirubin, Direct: 0.1 mg/dL (ref 0.0–0.3)
TOTAL PROTEIN: 6.6 g/dL (ref 6.0–8.3)
Total Bilirubin: 0.7 mg/dL (ref 0.2–1.2)

## 2014-03-02 LAB — LIPID PANEL
CHOLESTEROL: 152 mg/dL (ref 0–200)
HDL: 39 mg/dL — ABNORMAL LOW (ref >39)
LDL Cholesterol: 96 mg/dL (ref 0–99)
Total CHOL/HDL Ratio: 3.9 Ratio
Triglycerides: 87 mg/dL (ref <150)
VLDL: 17 mg/dL (ref 0–40)

## 2014-03-02 LAB — BASIC METABOLIC PANEL
BUN: 15 mg/dL (ref 6–23)
CO2: 27 meq/L (ref 19–32)
Calcium: 9.4 mg/dL (ref 8.4–10.5)
Chloride: 105 mEq/L (ref 96–112)
Creat: 0.63 mg/dL (ref 0.50–1.35)
Glucose, Bld: 138 mg/dL — ABNORMAL HIGH (ref 70–99)
Potassium: 4.6 mEq/L (ref 3.5–5.3)
SODIUM: 139 meq/L (ref 135–145)

## 2014-03-02 LAB — MICROALBUMIN, URINE: Microalb, Ur: 2.7 mg/dL — ABNORMAL HIGH (ref <2.0)

## 2014-03-03 ENCOUNTER — Ambulatory Visit (INDEPENDENT_AMBULATORY_CARE_PROVIDER_SITE_OTHER): Payer: 59 | Admitting: Family Medicine

## 2014-03-03 ENCOUNTER — Ambulatory Visit: Payer: Self-pay | Admitting: Family Medicine

## 2014-03-03 ENCOUNTER — Encounter: Payer: Self-pay | Admitting: Family Medicine

## 2014-03-03 VITALS — BP 160/92 | Ht 74.0 in | Wt 348.0 lb

## 2014-03-03 DIAGNOSIS — E1140 Type 2 diabetes mellitus with diabetic neuropathy, unspecified: Secondary | ICD-10-CM

## 2014-03-03 DIAGNOSIS — I878 Other specified disorders of veins: Secondary | ICD-10-CM

## 2014-03-03 DIAGNOSIS — E785 Hyperlipidemia, unspecified: Secondary | ICD-10-CM

## 2014-03-03 DIAGNOSIS — Z96651 Presence of right artificial knee joint: Secondary | ICD-10-CM

## 2014-03-03 DIAGNOSIS — I10 Essential (primary) hypertension: Secondary | ICD-10-CM

## 2014-03-03 LAB — PSA: PSA: 0.6 ng/mL (ref <=4.00)

## 2014-03-03 NOTE — Progress Notes (Signed)
   Subjective:    Patient ID: Brandon Villarreal, male    DOB: [birth year][place][birth year] [place][birth year], 64 y.o.   MRN: [id number]  Diabetes He presents for his follow-up diabetic visit. He has type 2 diabetes mellitus. There are no hypoglycemic associated symptoms. There are no diabetic associated symptoms. Current diabetic treatment includes insulin injections and oral agent (monotherapy). He is compliant with treatment all of the time. He monitors blood glucose at home 1-2 x per day. His highest blood glucose is 110-130 mg/dl. His overall blood glucose range is 110-130 mg/dl. He does not see a podiatrist.Eye exam is not current (No eye dr appt yet).   HgA1C was 8.1 on 01/02/14  Results for orders placed or performed in visit on 02/20/14  Lipid panel  Result Value Ref Range   Cholesterol 152 0 - 200 mg/dL   Triglycerides 87 <409<150 mg/dL   HDL 39 (L) >81>39 mg/dL   Total CHOL/HDL Ratio 3.9 Ratio   VLDL 17 0 - 40 mg/dL   LDL Cholesterol 96 0 - 99 mg/dL  Hepatic function panel  Result Value Ref Range   Total Bilirubin 0.7 0.2 - 1.2 mg/dL   Bilirubin, Direct 0.1 0.0 - 0.3 mg/dL   Indirect Bilirubin 0.6 0.2 - 1.2 mg/dL   Alkaline Phosphatase 68 39 - 117 U/L   AST 12 0 - 37 U/L   ALT 13 0 - 53 U/L   Total Protein 6.6 6.0 - 8.3 g/dL   Albumin 4.0 3.5 - 5.2 g/dL  Basic metabolic panel  Result Value Ref Range   Sodium 139 135 - 145 mEq/L   Potassium 4.6 3.5 - 5.3 mEq/L   Chloride 105 96 - 112 mEq/L   CO2 27 19 - 32 mEq/L   Glucose, Bld 138 (H) 70 - 99 mg/dL   BUN 15 6 - 23 mg/dL   Creat 1.910.63 4.780.50 - 2.951.35 mg/dL   Calcium 9.4 8.4 - 62.110.5 mg/dL  PSA  Result Value Ref Range   PSA  <=4.00 ng/mL  Microalbumin, urine  Result Value Ref Range   Microalb, Ur 2.7 (H) <2.0 mg/dL   Patient compliant with blood pressure medication.  Ongoing challenges with swelling in the legs. Compliant with medicine. Overall swelling is down.  Compliant with lipid medicine. No obvious side effects. Tolerating them well. Watching salt in diet.  Does not miss a dose.  Review of Systems No headache no chest pain no abdominal pain some chronic low back pain no change in bowel habits no blood in stool ROS otherwise negative    Objective:   Physical Exam  Alert no acute distress HEENT normal lungs clear heart regular rhythm blood pressure excellent repeat ankles 1+ edema      Assessment & Plan:  Impression 1 hypertension stable #2 hyperlipidemia good control #3 type 2 diabetes still suboptimal in discussed #4 venous stasis #5 recent stress with job loss pending plan maintain same meds. Diet exercise discussed. Recheck in several months. A1c then. May need to adjust medicine accordingly. WSL

## 2014-03-04 ENCOUNTER — Encounter (HOSPITAL_COMMUNITY): Payer: 59

## 2014-03-05 ENCOUNTER — Ambulatory Visit (HOSPITAL_COMMUNITY)
Admission: RE | Admit: 2014-03-05 | Discharge: 2014-03-05 | Disposition: A | Discharge status: Home / Self Care | Payer: 59 | Source: Ambulatory Visit | Attending: Orthopedic Surgery | Admitting: Orthopedic Surgery

## 2014-03-05 DIAGNOSIS — Z96651 Presence of right artificial knee joint: Secondary | ICD-10-CM | POA: Diagnosis not present

## 2014-03-05 DIAGNOSIS — Z471 Aftercare following joint replacement surgery: Secondary | ICD-10-CM | POA: Insufficient documentation

## 2014-03-05 DIAGNOSIS — M25661 Stiffness of right knee, not elsewhere classified: Secondary | ICD-10-CM | POA: Insufficient documentation

## 2014-03-05 DIAGNOSIS — R29898 Other symptoms and signs involving the musculoskeletal system: Secondary | ICD-10-CM

## 2014-03-05 DIAGNOSIS — M1711 Unilateral primary osteoarthritis, right knee: Secondary | ICD-10-CM

## 2014-03-05 NOTE — Therapy (Signed)
[facility] [address] [place], [place], [address] Phone: [phone]   Fax:  [phone]  Physical Therapy Treatment  Patient Details  Name: Brandon Villarreal MRN: [id number] Date of Birth: 07/10/1950 Referring Provider:  Dannielle HuhLucey, Steve, MD  Encounter Date: 03/05/2014      PT End of Session - 03/05/14 1632    Visit Number 10   Number of Visits 12   Date for PT Re-Evaluation 03/09/14   Authorization Type UHC   Authorization - Visit Number 10   Authorization - Number of Visits 12   PT Start Time 1600   PT Stop Time 1653   PT Time Calculation (min) 53 min   Activity Tolerance Patient tolerated treatment well   Behavior During Therapy [facility] for tasks assessed/performed      Past Medical History  Diagnosis Date  . Hypertension   . Hyperlipidemia   . ED (erectile dysfunction)   . Obesity   . Sensory neuropathy   . Microproteinuria   . Venous stasis   . Diabetes mellitus     fasting 90-120  . Arthritis     Past Surgical History  Procedure Laterality Date  . Replacement total knee      left  . Back surgery      lumbar disc  . Hammer toe surgery  02/10/2011    Procedure: HAMMER TOE CORRECTION;  Surgeon: Dallas SchimkeBenjamin Ivan McKinney;  Location: [facility];  Service: Orthopedics;  Laterality: Right;  Percutaneous Flexor Tenotomy third Toe Right Foot  . Joint replacement Left     knee  . Hernia repair      umbilical  . Total knee arthroplasty Right 07/29/2013    Procedure: TOTAL KNEE ARTHROPLASTY;  Surgeon: Dannielle HuhSteve Lucey, MD;  Location: [facility] OR;  Service: Orthopedics;  Laterality: Right;    There were no vitals taken for this visit.  Visit Diagnosis:  Knee stiffness, right  Ankle weakness  Primary osteoarthritis of right knee  Weakness of right hip      Subjective Assessment - 03/05/14 1602    Symptoms Pt c/o stiffness today, beluieves may be related to weather or shingles shot on Monday.  Pt stated MD happy with progress   Currently  in Pain? No/denies          [facility] - 03/05/14 0001    Assessment   Medical Diagnosis weakness s/p Rt knee replacement.    Onset Date 07/08/13   Next MD Visit Dannielle HuhSteve Lucey June 2016   Prior Therapy yes but patient notes low intensity.                   [facility] Adult PT Treatment/Exercise - 03/05/14 0001    Exercises   Exercises Knee/Hip   Knee/Hip Exercises: Stretches   Active Hamstring Stretch 3 reps;20 seconds   Active Hamstring Stretch Limitations 3 Way, 14" step   Quad Stretch 3 reps;30 seconds   Quad Stretch Limitations Prone with rope   Hip Flexor Stretch Limitations 3 way knee driver on 12" box 13K10x to increase knee flexion   Knee: Self-Stretch Limitations 10 x3" on 8in step for improve flexion 3way   Gastroc Stretch 3 reps;20 seconds   Gastroc Stretch Limitations slant board   Knee/Hip Exercises: Standing   Forward Lunges Both;15 reps   Forward Lunges Limitations 4in step with arm reaches to increase knee flexion   Side Lunges Both;15 reps   Side Lunges Limitations 4in step    Functional Squat  5 reps   Functional Squat Limitations SFT squat with 10lb dumbbells with frontal plane neutral.    Stairs 2RT reciprocal pattern on 4in with 1 HR; 5RT ascemd/descend 7in with 2 HR; cueing and increased time noted while descending stairs.   Other Standing Knee Exercises Sumo walking 1RT   Other Standing Knee Exercises Squat 5x to chair 3 sets: neutral and split stance.    Knee/Hip Exercises: Seated   Other Seated Knee Exercises 10 STS 20 in                  PT Short Term Goals - 03/05/14 1632    PT SHORT TERM GOAL #1   Title Patient will be abel to stand from 18" chair without UE supprt   Status Achieved   PT SHORT TERM GOAL #2   Title Patient will be able to perform a lunge to the floor and return to be able to walk with increased stride length   Status On-going   PT SHORT TERM GOAL #3   Title Patient will be able to ambulate up and down 4"  stairs without UE supprt   Status On-going           PT Long Term Goals - 03/05/14 1634    PT LONG TERM GOAL #1   Title Patient will be abel to stand from 18" chair without UE supprt 5x in <15 seconds indicating patient not at increased risk for falls   Status On-going   PT LONG TERM GOAL #2   Title Patient will be able to single leg stand >3 seconds   Status Achieved   PT LONG TERM GOAL #3   Title Patient will be able to ambualte up and down 8" stairs without UE supprt   Status On-going               Plan - 03/05/14 1635    Clinical Impression Statement Session focus on functional strengthening and exercises to improve knee ROM.  Pt continues to present with increased edema Bil LE that limited knee flexion.  Pt encoraged to continue wearing compression hose to assist with edema.  Added reaches with lunges on 4 in step to increase flexion.  Pt able to sit to stand no HHA from 20 in chair but requires momentum or multiple attempts to stand. Able tp demonstrate reciprocal pattern wtih 1 HHA 4 in, still requires 2 HHA with 7in step height while descending stairs.   PT Next Visit Plan Continue PT POC x 1 more session wiht focus on functional strengthening to increase ease with sit to stands, increase knee flexion during ascending/descending stairs amd edicatopm t pm advanced HEP in anticipation of discharge.        Problem List Patient Active Problem List   Diagnosis Date Noted  . S/P total knee arthroplasty 07/29/2013  . Essential hypertension, benign 06/28/2012  . Diabetes 06/28/2012  . Hyperlipidemia LDL goal <100 06/28/2012  . Erectile dysfunction 06/28/2012  . Venous stasis 06/28/2012  . 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 Sensory neuropathy 06/28/2012   Becky Sax, LPTA [phone]  Juel Burrow 03/05/2014, 5:04 PM  [facility] [address]  [place], [place], [address] Phone: [phone]   Fax:  [phone]

## 2014-03-07 ENCOUNTER — Ambulatory Visit (HOSPITAL_COMMUNITY)
Admission: RE | Admit: 2014-03-07 | Discharge: 2014-03-07 | Disposition: A | Discharge status: Home / Self Care | Payer: 59 | Source: Ambulatory Visit | Attending: Orthopedic Surgery | Admitting: Orthopedic Surgery

## 2014-03-07 DIAGNOSIS — Z471 Aftercare following joint replacement surgery: Secondary | ICD-10-CM | POA: Diagnosis not present

## 2014-03-07 DIAGNOSIS — M25661 Stiffness of right knee, not elsewhere classified: Secondary | ICD-10-CM

## 2014-03-07 DIAGNOSIS — R29898 Other symptoms and signs involving the musculoskeletal system: Secondary | ICD-10-CM

## 2014-03-07 DIAGNOSIS — M1711 Unilateral primary osteoarthritis, right knee: Secondary | ICD-10-CM

## 2014-03-07 NOTE — Therapy (Signed)
[facility] [address] [place], [place], [address] Phone: [phone]   Fax:  [phone]  Physical Therapy Treatment  Patient Details  Name: Brandon Villarreal MRN: [id number] Date of Birth: 04/29/1950 Referring Provider:  Lucey, Steve, MD  Encounter Date: 03/07/2014      PT End of Session - 03/07/14 1617    Visit Number 11   Number of Visits 12   Authorization Type UHC   Authorization - Visit Number 11   Authorization - Number of Visits 12   PT Start Time 1600   PT Stop Time 1653   PT Time Calculation (min) 53 min   Activity Tolerance Patient tolerated treatment well   Behavior During Therapy [facility] for tasks assessed/performed      Past Medical History  Diagnosis Date  . Hypertension   . Hyperlipidemia   . ED (erectile dysfunction)   . Obesity   . Sensory neuropathy   . Microproteinuria   . Venous stasis   . Diabetes mellitus     fasting 90-120  . Arthritis     Past Surgical History  Procedure Laterality Date  . Replacement total knee      left  . Back surgery      lumbar disc  . Hammer toe surgery  02/10/2011    Procedure: HAMMER TOE CORRECTION;  Surgeon: Benjamin Ivan McKinney;  Location: [facility];  Service: Orthopedics;  Laterality: Right;  Percutaneous Flexor Tenotomy third Toe Right Foot  . Joint replacement Left     knee  . Hernia repair      umbilical  . Total knee arthroplasty Right 07/29/2013    Procedure: TOTAL KNEE ARTHROPLASTY;  Surgeon: Steve Lucey, MD;  Location: [facility] OR;  Service: Orthopedics;  Laterality: Right;    There were no vitals taken for this visit.  Visit Diagnosis:  Knee stiffness, right  Ankle weakness  Primary osteoarthritis of right knee  Weakness of right hip      Subjective Assessment - 03/07/14 1602    Symptoms Pt stated today is last day with PT, feels he can do therapy by himself at home.   How long can you sit comfortably? unlimited   How long can you stand comfortably? Able  to stand for one hour   How long can you walk comfortably? Able to walk for 10-15 minutes (was always hurting with gait 4 weeks ago)   Currently in Pain? No/denies          [facility] PT Assessment - 03/07/14 0001    Assessment   Medical Diagnosis weakness s/p Rt knee replacement.    Onset Date 07/08/13   Next MD Visit Steve Lucey June 2016   Prior Therapy yes but patient notes low intensity.    Observation/Other Assessments   Focus on Therapeutic Outcomes (FOTO)  49% limitation was 31%   Sit to Stand   Comments requires UE support, max depth withtou UE support is 19",    Other:   Other/ Comments Stairs: Uniateral UE support for 7" steps. able to perform 4" step without 1 UE support   Stairs: Bilateral UE support for 7" steps. able to perform 4   Other:   Other/Comments Able to single elg balance >10 seconds but unabl to bend knee >10 degrees   AROM   Right Knee Extension 0   Right Knee Flexion 109  was 116   Left Knee Extension 0   Left Knee Flexion 100   Strength     Right Hip Flexion 5/5   Right Hip Extension 4-/5  was 3+/5   Right Hip ABduction 3+/5  was 3+/5   Right Hip ADduction 3+/5   Left Hip Flexion 5/5   Left Hip Extension 4/5   Left Hip ABduction 4-/5  was 3+/5   Right Knee Flexion 5/5  was 4+/5   Right Knee Extension 5/5  was 4+/5   Left Knee Flexion 5/5   Left Knee Extension 5/5   Right Ankle Dorsiflexion 5/5   Left Ankle Dorsiflexion 5/5   Flexibility   Soft Tissue Assessment /Muscle Lenght yes   Quadriceps Rt 14in in, Lt 17in  Rt 14 1/4 in, Lt 19 1/2 in           [facility] Adult PT Treatment/Exercise - 03/07/14 1614    Exercises   Exercises Knee/Hip   Knee/Hip Exercises: Stretches   Active Hamstring Stretch 3 reps;20 seconds   Active Hamstring Stretch Limitations 3 Way, 14" step   Quad Stretch 3 reps;30 seconds   Quad Stretch Limitations Prone with rope   Hip Flexor Stretch Limitations 3 way knee driver on 12" box 38V to increase knee flexion    Gastroc Stretch 3 reps;30 seconds   Gastroc Stretch Limitations slant board   Knee/Hip Exercises: Standing   Forward Lunges Both;5 reps   Forward Lunges Limitations common lunge matrix   Functional Squat 5 reps   Functional Squat Limitations SFT squat with 10lb dumbbells with frontal plane neutral.    Stairs 5RT reciprocal pattern 4 in 7in ascending/descending            PT Short Term Goals - 03/07/14 1604    PT SHORT TERM GOAL #1   Title Patient will be abel to stand from 18" chair without UE supprt   Baseline 03/07/14- unable to transfer sit to stand without use of UE   Status Not Met   PT SHORT TERM GOAL #2   Title Patient will be able to perform a lunge to the floor and return to be able to walk with increased stride length   Baseline Able to lunge with good stride length, though unable to lunge to floor   Status Partially Met   PT SHORT TERM GOAL #3   Title Patient will be able to ambulate up and down 4" stairs without UE supprt   Baseline  03/07/14-Able to ascend/descend stairs with reciprocal gait with 1 HHA required for balance   Status Partially Met           PT Long Term Goals - 03/07/14 1605    PT LONG TERM GOAL #1   Title Patient will be abel to stand from 18" chair without UE supprt 5x in <15 seconds indicating patient not at increased risk for falls   Baseline 03/07/2014- unable to stand from 18in chair without UE support   Status Not Met   PT LONG TERM GOAL #2   Title Patient will be able to single leg stand >3 seconds   Status Achieved   PT LONG TERM GOAL #3   Title Patient will be able to ambualte up and down 8" stairs without UE supprt   Baseline 03/07/14- able to ascend and descend reciprocal pattern but does require HHA and increased time descending   Status Partially Met           Plan - 03/07/14 1618    Clinical Impression Statement Reassessment complete per pt. request with the following findings:  Pt continues to have  increased swelling limiiting  ROM which increases difficutly with functional tasks like sit to stands without HHA as well as weak gluteal musculature.  Pt continues to require HHA with reciprocal pattern on 4 and 7in step with increased time descending stairs due to weak ecccentric control.  Overall LE strength is progressin.  Pt given HEP printout to progress at home.   PT Next Visit Plan Recommend D/C to HEP per pt request, advanced HEP given.        Problem List Patient Active Problem List   Diagnosis Date Noted  . S/P total knee arthroplasty 07/29/2013  . Essential hypertension, benign 06/28/2012  . Diabetes 06/28/2012  . Hyperlipidemia LDL goal <100 06/28/2012  . Erectile dysfunction 06/28/2012  . Venous stasis 06/28/2012  . 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 Sensory neuropathy 06/28/2012   Casey Cockerham, LPTA [phone]   Cockerham, [name] 03/07/2014, 5:45 PM  [facility] [address] [place], [place], [address] Phone: [phone]   Fax:  [phone]   PHYSICAL THERAPY DISCHARGE SUMMARY  Visits from Start of Care: 16  Current functional level related to goals / functional outcomes: See above   Remaining deficits: See above    Plan: Patient agrees to discharge.  Patient goals were partially met. Patient is being discharged due to financial reasons.  ?????       Cash DeWitt PT DPT [phone]  

## 2014-03-09 ENCOUNTER — Encounter: Payer: Self-pay | Admitting: Family Medicine

## 2014-04-10 ENCOUNTER — Ambulatory Visit: Payer: 59 | Admitting: Family Medicine

## 2014-06-02 ENCOUNTER — Ambulatory Visit: Payer: 59 | Admitting: Family Medicine

## 2014-06-02 DIAGNOSIS — Z029 Encounter for administrative examinations, unspecified: Secondary | ICD-10-CM

## 2014-06-10 ENCOUNTER — Other Ambulatory Visit: Payer: Self-pay | Admitting: Family Medicine

## 2014-06-11 ENCOUNTER — Encounter: Payer: Self-pay | Admitting: Family Medicine

## 2014-06-11 ENCOUNTER — Ambulatory Visit (INDEPENDENT_AMBULATORY_CARE_PROVIDER_SITE_OTHER): Payer: BLUE CROSS/BLUE SHIELD | Admitting: Family Medicine

## 2014-06-11 VITALS — BP 150/90 | Ht 74.0 in | Wt 351.0 lb

## 2014-06-11 DIAGNOSIS — M1711 Unilateral primary osteoarthritis, right knee: Secondary | ICD-10-CM

## 2014-06-11 DIAGNOSIS — E119 Type 2 diabetes mellitus without complications: Secondary | ICD-10-CM

## 2014-06-11 DIAGNOSIS — E785 Hyperlipidemia, unspecified: Secondary | ICD-10-CM

## 2014-06-11 DIAGNOSIS — I10 Essential (primary) hypertension: Secondary | ICD-10-CM | POA: Diagnosis not present

## 2014-06-11 DIAGNOSIS — E1140 Type 2 diabetes mellitus with diabetic neuropathy, unspecified: Secondary | ICD-10-CM

## 2014-06-11 DIAGNOSIS — I878 Other specified disorders of veins: Secondary | ICD-10-CM

## 2014-06-11 DIAGNOSIS — M129 Arthropathy, unspecified: Secondary | ICD-10-CM | POA: Diagnosis not present

## 2014-06-11 LAB — POCT GLYCOSYLATED HEMOGLOBIN (HGB A1C): Hemoglobin A1C: 9

## 2014-06-11 MED ORDER — BENAZEPRIL HCL 40 MG PO TABS: 40.0000 mg | ORAL_TABLET | Freq: Every day | ORAL | Status: AC

## 2014-06-11 MED ORDER — POTASSIUM CHLORIDE CRYS ER 20 MEQ PO TBCR: EXTENDED_RELEASE_TABLET | ORAL | Status: AC

## 2014-06-11 MED ORDER — GLYBURIDE-METFORMIN 5-500 MG PO TABS: 2.0000 | ORAL_TABLET | Freq: Two times a day (BID) | ORAL | Status: AC

## 2014-06-11 MED ORDER — AMLODIPINE BESYLATE 10 MG PO TABS: 5.0000 mg | ORAL_TABLET | Freq: Two times a day (BID) | ORAL | Status: AC

## 2014-06-11 MED ORDER — PRAVASTATIN SODIUM 20 MG PO TABS: 20.0000 mg | ORAL_TABLET | Freq: Every evening | ORAL | Status: AC

## 2014-06-11 MED ORDER — DOXAZOSIN MESYLATE 2 MG PO TABS: 2.0000 mg | ORAL_TABLET | Freq: Every day | ORAL | Status: AC

## 2014-06-11 MED ORDER — FENOFIBRATE 160 MG PO TABS: 160.0000 mg | ORAL_TABLET | Freq: Every day | ORAL | Status: AC

## 2014-06-11 MED ORDER — INSULIN GLARGINE 100 UNIT/ML ~~LOC~~ SOLN: 60.0000 [IU] | Freq: Two times a day (BID) | SUBCUTANEOUS | Status: AC

## 2014-06-11 MED ORDER — FUROSEMIDE 40 MG PO TABS: 40.0000 mg | ORAL_TABLET | Freq: Every day | ORAL | Status: AC

## 2014-06-11 NOTE — Progress Notes (Signed)
   Subjective:    Patient ID: Brandon KettleRobert F Villarreal, male    DOB: 06/17/1950, 64 y.o.   MRN: [id number]  Diabetes He presents for his follow-up diabetic visit. He has type 2 diabetes mellitus. His disease course has been stable. There are no hypoglycemic associated symptoms. There are no diabetic associated symptoms. There are no hypoglycemic complications. Symptoms are stable. There are no diabetic complications. There are no known risk factors for coronary artery disease. Current diabetic treatment includes insulin injections. He is compliant with treatment all of the time.   Patient wants to discuss getting disability due to knee pain and swelling. With severe chronic venous stasis and bilateral knee pain patient having difficulty standing for any substantial period of time. Also progressive difficulty with just plain walking  BP meds compliant, until recently ran out a few days,. And has not filled out again.   States most fasting sugars ranging anywhere from 80-175.  Pt got laid off in February  Left knee giving the post fits, twelve yrs post knee replacement Results for orders placed or performed in visit on 06/11/14  POCT glycosylated hemoglobin (Hb A1C)  Result Value Ref Range   Hemoglobin A1C 9.0     Right knee still causing problems   Due to see orthopedist soon. Next  Ongoing challenges with swelling of the legs and ankles. No recent ulcerations no weeping Review of Systems     No headache no chest pain no back pain no abdominal pain no change in bowel habits chronic fatigue and tiredness definitely present. Chronic severe knee pain present. Objective:   Physical Exam  alert vital signs stable blood pressure elevated on repeat. Morbid obesity present lungs clear heart rare rhythm ankles chronic venous stasis changes knees bilateral crepitations       Assessment & Plan:   impression 1 type 2 diabetes frankly poor control. Question compliance #2 hypertension poor control with  noncompliance #3 chronic venous stasis aggravating and substantial #4 chronic and severe knee pain progressive and disabling from patient's perspective #5 morbid obesity discussed plan resume blood pressure medication. Recommended diabetes specialist patient defers for now. Not taking invokana so stop. Increase insulin 60 units twice a day worked very hard on diet recheck in 3 months

## 2014-07-16 DIAGNOSIS — Z029 Encounter for administrative examinations, unspecified: Secondary | ICD-10-CM

## 2014-09-15 ENCOUNTER — Ambulatory Visit: Payer: BLUE CROSS/BLUE SHIELD | Admitting: Family Medicine

## 2014-11-12 ENCOUNTER — Telehealth: Payer: Self-pay | Admitting: Family Medicine

## 2014-11-12 DIAGNOSIS — E785 Hyperlipidemia, unspecified: Secondary | ICD-10-CM

## 2014-11-12 DIAGNOSIS — Z79899 Other long term (current) drug therapy: Secondary | ICD-10-CM

## 2014-11-12 DIAGNOSIS — Z125 Encounter for screening for malignant neoplasm of prostate: Secondary | ICD-10-CM

## 2014-11-12 DIAGNOSIS — E119 Type 2 diabetes mellitus without complications: Secondary | ICD-10-CM

## 2014-11-12 NOTE — Telephone Encounter (Signed)
Left message on voicemail notifying patient that blood work has been ordered.  

## 2014-11-12 NOTE — Telephone Encounter (Signed)
Pt would like lab orders to be sent over for his upcoming appt. Last labs per epic were: lipid,hepatic,bmp,psa,and microalbumin on 03/01/14

## 2014-11-12 NOTE — Telephone Encounter (Signed)
Rep all same 

## 2014-11-22 ENCOUNTER — Inpatient Hospital Stay (HOSPITAL_COMMUNITY): Payer: BLUE CROSS/BLUE SHIELD

## 2014-11-22 ENCOUNTER — Inpatient Hospital Stay (HOSPITAL_COMMUNITY): Payer: BLUE CROSS/BLUE SHIELD | Admitting: Anesthesiology

## 2014-11-22 ENCOUNTER — Inpatient Hospital Stay (HOSPITAL_COMMUNITY)
Admission: EM | Admit: 2014-11-22 | Discharge: 2014-12-02 | DRG: 853 | Disposition: E | Payer: BLUE CROSS/BLUE SHIELD | Attending: Pulmonary Disease | Admitting: Pulmonary Disease

## 2014-11-22 ENCOUNTER — Emergency Department (HOSPITAL_COMMUNITY): Payer: BLUE CROSS/BLUE SHIELD

## 2014-11-22 ENCOUNTER — Encounter (HOSPITAL_COMMUNITY): Payer: Self-pay

## 2014-11-22 DIAGNOSIS — R34 Anuria and oliguria: Secondary | ICD-10-CM | POA: Diagnosis not present

## 2014-11-22 DIAGNOSIS — Z515 Encounter for palliative care: Secondary | ICD-10-CM | POA: Diagnosis not present

## 2014-11-22 DIAGNOSIS — Z978 Presence of other specified devices: Secondary | ICD-10-CM

## 2014-11-22 DIAGNOSIS — Z6841 Body Mass Index (BMI) 40.0 and over, adult: Secondary | ICD-10-CM | POA: Diagnosis not present

## 2014-11-22 DIAGNOSIS — R0902 Hypoxemia: Secondary | ICD-10-CM

## 2014-11-22 DIAGNOSIS — Z794 Long term (current) use of insulin: Secondary | ICD-10-CM

## 2014-11-22 DIAGNOSIS — E872 Acidosis: Secondary | ICD-10-CM | POA: Diagnosis present

## 2014-11-22 DIAGNOSIS — G9341 Metabolic encephalopathy: Secondary | ICD-10-CM | POA: Diagnosis present

## 2014-11-22 DIAGNOSIS — I10 Essential (primary) hypertension: Secondary | ICD-10-CM | POA: Diagnosis present

## 2014-11-22 DIAGNOSIS — I878 Other specified disorders of veins: Secondary | ICD-10-CM | POA: Diagnosis present

## 2014-11-22 DIAGNOSIS — E785 Hyperlipidemia, unspecified: Secondary | ICD-10-CM | POA: Diagnosis present

## 2014-11-22 DIAGNOSIS — Z79899 Other long term (current) drug therapy: Secondary | ICD-10-CM

## 2014-11-22 DIAGNOSIS — N179 Acute kidney failure, unspecified: Secondary | ICD-10-CM | POA: Diagnosis not present

## 2014-11-22 DIAGNOSIS — J189 Pneumonia, unspecified organism: Secondary | ICD-10-CM | POA: Diagnosis present

## 2014-11-22 DIAGNOSIS — Z452 Encounter for adjustment and management of vascular access device: Secondary | ICD-10-CM

## 2014-11-22 DIAGNOSIS — I5042 Chronic combined systolic (congestive) and diastolic (congestive) heart failure: Secondary | ICD-10-CM | POA: Diagnosis present

## 2014-11-22 DIAGNOSIS — Z7982 Long term (current) use of aspirin: Secondary | ICD-10-CM

## 2014-11-22 DIAGNOSIS — A481 Legionnaires' disease: Secondary | ICD-10-CM | POA: Diagnosis present

## 2014-11-22 DIAGNOSIS — E876 Hypokalemia: Secondary | ICD-10-CM | POA: Diagnosis present

## 2014-11-22 DIAGNOSIS — R1310 Dysphagia, unspecified: Secondary | ICD-10-CM | POA: Diagnosis present

## 2014-11-22 DIAGNOSIS — E119 Type 2 diabetes mellitus without complications: Secondary | ICD-10-CM

## 2014-11-22 DIAGNOSIS — A419 Sepsis, unspecified organism: Principal | ICD-10-CM | POA: Insufficient documentation

## 2014-11-22 DIAGNOSIS — Z66 Do not resuscitate: Secondary | ICD-10-CM | POA: Diagnosis present

## 2014-11-22 DIAGNOSIS — I959 Hypotension, unspecified: Secondary | ICD-10-CM | POA: Diagnosis present

## 2014-11-22 DIAGNOSIS — I509 Heart failure, unspecified: Secondary | ICD-10-CM | POA: Diagnosis not present

## 2014-11-22 DIAGNOSIS — J80 Acute respiratory distress syndrome: Secondary | ICD-10-CM

## 2014-11-22 DIAGNOSIS — J9601 Acute respiratory failure with hypoxia: Secondary | ICD-10-CM

## 2014-11-22 DIAGNOSIS — Z4659 Encounter for fitting and adjustment of other gastrointestinal appliance and device: Secondary | ICD-10-CM

## 2014-11-22 DIAGNOSIS — E1165 Type 2 diabetes mellitus with hyperglycemia: Secondary | ICD-10-CM | POA: Diagnosis not present

## 2014-11-22 DIAGNOSIS — J9600 Acute respiratory failure, unspecified whether with hypoxia or hypercapnia: Secondary | ICD-10-CM

## 2014-11-22 DIAGNOSIS — E875 Hyperkalemia: Secondary | ICD-10-CM | POA: Diagnosis present

## 2014-11-22 DIAGNOSIS — R6521 Severe sepsis with septic shock: Secondary | ICD-10-CM | POA: Diagnosis present

## 2014-11-22 DIAGNOSIS — E1140 Type 2 diabetes mellitus with diabetic neuropathy, unspecified: Secondary | ICD-10-CM | POA: Diagnosis present

## 2014-11-22 DIAGNOSIS — Z9289 Personal history of other medical treatment: Secondary | ICD-10-CM

## 2014-11-22 DIAGNOSIS — Z96653 Presence of artificial knee joint, bilateral: Secondary | ICD-10-CM | POA: Diagnosis present

## 2014-11-22 LAB — BLOOD GAS, ARTERIAL
ACID-BASE DEFICIT: 4.3 mmol/L — AB (ref 0.0–2.0)
ACID-BASE DEFICIT: 3.7 mmol/L — AB (ref 0.0–2.0)
Acid-Base Excess: 0.8 mmol/L (ref 0.0–2.0)
Acid-base deficit: 2.2 mmol/L — ABNORMAL HIGH (ref 0.0–2.0)
Acid-base deficit: 3.9 mmol/L — ABNORMAL HIGH (ref 0.0–2.0)
BICARBONATE: 24 meq/L (ref 20.0–24.0)
BICARBONATE: 20.7 meq/L (ref 20.0–24.0)
BICARBONATE: 21.9 meq/L (ref 20.0–24.0)
BICARBONATE: 21.3 meq/L (ref 20.0–24.0)
Bicarbonate: 22.6 mEq/L (ref 20.0–24.0)
DELIVERY SYSTEMS: POSITIVE
DRAWN BY: 234301
Drawn by: 105551
Drawn by: 234301
Drawn by: 437071
Drawn by: 437071
Expiratory PAP: 10
FIO2: 100
FIO2: 90
FIO2: 100
FIO2: 1
FIO2: 1
Inspiratory PAP: 10
LHR: 20 {breaths}/min
LHR: 25 {breaths}/min
LHR: 30 {breaths}/min
MODE: POSITIVE
O2 Content: 15 L/min
O2 SAT: 96.6 %
O2 SAT: 98.6 %
O2 Saturation: 95.7 %
O2 Saturation: 87.6 %
O2 Saturation: 89.6 %
PATIENT TEMPERATURE: 37
PCO2 ART: 36.1 mmHg (ref 35.0–45.0)
PCO2 ART: 40.8 mmHg (ref 35.0–45.0)
PEEP/CPAP: 24 cmH2O
PEEP/CPAP: 20 cmH2O
PEEP/CPAP: 20 cmH2O
PH ART: 7.421 (ref 7.350–7.450)
PH ART: 7.325 — AB (ref 7.350–7.450)
PH ART: 7.305 — AB (ref 7.350–7.450)
PO2 ART: 89.7 mmHg (ref 80.0–100.0)
Patient temperature: 103.6
Patient temperature: 101.5
TCO2: 17.5 mmol/L (ref 0–100)
TCO2: 23.4 mmol/L (ref 0–100)
TCO2: 22.6 mmol/L (ref 0–100)
VT: 680 mL
VT: 590 mL
VT: 590 mL
pCO2 arterial: 37.9 mmHg (ref 35.0–45.0)
pCO2 arterial: 56.4 mmHg — ABNORMAL HIGH (ref 35.0–45.0)
pCO2 arterial: 45.3 mmHg — ABNORMAL HIGH (ref 35.0–45.0)
pH, Arterial: 7.383 (ref 7.350–7.450)
pH, Arterial: 7.232 — ABNORMAL LOW (ref 7.350–7.450)
pO2, Arterial: 80 mmHg (ref 80.0–100.0)
pO2, Arterial: 127 mmHg — ABNORMAL HIGH (ref 80.0–100.0)
pO2, Arterial: 67.9 mmHg — ABNORMAL LOW (ref 80.0–100.0)
pO2, Arterial: 64.2 mmHg — ABNORMAL LOW (ref 80.0–100.0)

## 2014-11-22 LAB — POCT I-STAT 3, ART BLOOD GAS (G3+)
Acid-base deficit: 4 mmol/L — ABNORMAL HIGH (ref 0.0–2.0)
BICARBONATE: 22 meq/L (ref 20.0–24.0)
O2 Saturation: 90 %
PCO2 ART: 48.6 mmHg — AB (ref 35.0–45.0)
PO2 ART: 80 mmHg (ref 80.0–100.0)
Patient temperature: 105.1
TCO2: 23 mmol/L (ref 0–100)
pH, Arterial: 7.28 — ABNORMAL LOW (ref 7.350–7.450)

## 2014-11-22 LAB — URINALYSIS, ROUTINE W REFLEX MICROSCOPIC
Glucose, UA: 500 mg/dL — AB
Leukocytes, UA: NEGATIVE
NITRITE: NEGATIVE
PH: 6 (ref 5.0–8.0)
Protein, ur: 30 mg/dL — AB
SPECIFIC GRAVITY, URINE: 1.02 (ref 1.005–1.030)
UROBILINOGEN UA: 4 mg/dL — AB (ref 0.0–1.0)

## 2014-11-22 LAB — RAPID URINE DRUG SCREEN, HOSP PERFORMED
AMPHETAMINES: NOT DETECTED
BARBITURATES: NOT DETECTED
BENZODIAZEPINES: POSITIVE — AB
Cocaine: NOT DETECTED
Opiates: NOT DETECTED
TETRAHYDROCANNABINOL: NOT DETECTED

## 2014-11-22 LAB — DIFFERENTIAL
BASOS ABS: 0 10*3/uL (ref 0.0–0.1)
BASOS PCT: 0 %
EOS ABS: 0 10*3/uL (ref 0.0–0.7)
Eosinophils Relative: 0 %
Lymphocytes Relative: 7 %
Lymphs Abs: 0.5 10*3/uL — ABNORMAL LOW (ref 0.7–4.0)
Monocytes Absolute: 0.4 10*3/uL (ref 0.1–1.0)
Monocytes Relative: 5 %
NEUTROS ABS: 6.5 10*3/uL (ref 1.7–7.7)
NEUTROS PCT: 88 %

## 2014-11-22 LAB — I-STAT CG4 LACTIC ACID, ED
LACTIC ACID, VENOUS: 2.16 mmol/L — AB (ref 0.5–2.0)
LACTIC ACID, VENOUS: 2.18 mmol/L — AB (ref 0.5–2.0)

## 2014-11-22 LAB — CBC WITH DIFFERENTIAL/PLATELET
BASOS ABS: 0 10*3/uL (ref 0.0–0.1)
BASOS PCT: 0 %
Eosinophils Absolute: 0 10*3/uL (ref 0.0–0.7)
Eosinophils Relative: 0 %
HEMATOCRIT: 37.9 % — AB (ref 39.0–52.0)
Hemoglobin: 13.5 g/dL (ref 13.0–17.0)
Lymphocytes Relative: 5 %
Lymphs Abs: 0.5 10*3/uL — ABNORMAL LOW (ref 0.7–4.0)
MCH: 28.7 pg (ref 26.0–34.0)
MCHC: 35.6 g/dL (ref 30.0–36.0)
MCV: 80.6 fL (ref 78.0–100.0)
MONO ABS: 0.5 10*3/uL (ref 0.1–1.0)
Monocytes Relative: 6 %
NEUTROS ABS: 7.5 10*3/uL (ref 1.7–7.7)
Neutrophils Relative %: 89 %
PLATELETS: 132 10*3/uL — AB (ref 150–400)
RBC: 4.7 MIL/uL (ref 4.22–5.81)
RDW: 13.6 % (ref 11.5–15.5)
WBC: 8.5 10*3/uL (ref 4.0–10.5)

## 2014-11-22 LAB — GLUCOSE, CAPILLARY
Glucose-Capillary: 206 mg/dL — ABNORMAL HIGH (ref 65–99)
Glucose-Capillary: 182 mg/dL — ABNORMAL HIGH (ref 65–99)
Glucose-Capillary: 181 mg/dL — ABNORMAL HIGH (ref 65–99)
Glucose-Capillary: 145 mg/dL — ABNORMAL HIGH (ref 65–99)

## 2014-11-22 LAB — D-DIMER, QUANTITATIVE: D-Dimer, Quant: 6.12 ug/mL-FEU — ABNORMAL HIGH (ref 0.00–0.48)

## 2014-11-22 LAB — COMPREHENSIVE METABOLIC PANEL
ALBUMIN: 2.9 g/dL — AB (ref 3.5–5.0)
ALK PHOS: 123 U/L (ref 38–126)
ALT: 70 U/L — ABNORMAL HIGH (ref 17–63)
ANION GAP: 11 (ref 5–15)
AST: 72 U/L — ABNORMAL HIGH (ref 15–41)
BILIRUBIN TOTAL: 2.3 mg/dL — AB (ref 0.3–1.2)
BUN: 17 mg/dL (ref 6–20)
CALCIUM: 9 mg/dL (ref 8.9–10.3)
CO2: 23 mmol/L (ref 22–32)
Chloride: 97 mmol/L — ABNORMAL LOW (ref 101–111)
Creatinine, Ser: 0.7 mg/dL (ref 0.61–1.24)
GLUCOSE: 236 mg/dL — AB (ref 65–99)
POTASSIUM: 3.1 mmol/L — AB (ref 3.5–5.1)
Sodium: 131 mmol/L — ABNORMAL LOW (ref 135–145)
TOTAL PROTEIN: 6.3 g/dL — AB (ref 6.5–8.1)

## 2014-11-22 LAB — I-STAT TROPONIN, ED: TROPONIN I, POC: 0.03 ng/mL (ref 0.00–0.08)

## 2014-11-22 LAB — BRAIN NATRIURETIC PEPTIDE
B NATRIURETIC PEPTIDE 5: 64 pg/mL (ref 0.0–100.0)
B Natriuretic Peptide: 61 pg/mL (ref 0.0–100.0)

## 2014-11-22 LAB — URINE MICROSCOPIC-ADD ON

## 2014-11-22 LAB — TRIGLYCERIDES: Triglycerides: 214 mg/dL — ABNORMAL HIGH (ref <150)

## 2014-11-22 LAB — PROCALCITONIN: PROCALCITONIN: 1.8 ng/mL

## 2014-11-22 LAB — INFLUENZA PANEL BY PCR (TYPE A & B)
H1N1 flu by pcr: NOT DETECTED
INFLAPCR: NEGATIVE
Influenza B By PCR: NEGATIVE

## 2014-11-22 LAB — STREP PNEUMONIAE URINARY ANTIGEN: Strep Pneumo Urinary Antigen: NEGATIVE

## 2014-11-22 LAB — MRSA PCR SCREENING: MRSA by PCR: INVALID — AB

## 2014-11-22 MED ORDER — ACETAMINOPHEN 160 MG/5ML PO SOLN
650.0000 mg | Freq: Four times a day (QID) | ORAL | Status: DC | PRN
  Administered 2014-11-22 – 2014-11-23 (×3): 650 mg
  Filled 2014-11-22 (×3): qty 20.3

## 2014-11-22 MED ORDER — FENTANYL CITRATE (PF) 2500 MCG/50ML IJ SOLN
25.0000 ug/h | INTRAMUSCULAR | Status: DC
  Administered 2014-11-22: 250 ug/h via INTRAVENOUS
  Filled 2014-11-22: qty 50

## 2014-11-22 MED ORDER — INSULIN GLARGINE 100 UNIT/ML ~~LOC~~ SOLN
60.0000 [IU] | Freq: Two times a day (BID) | SUBCUTANEOUS | Status: DC
  Administered 2014-11-22 – 2014-11-24 (×6): 60 [IU] via SUBCUTANEOUS
  Filled 2014-11-22 (×14): qty 0.6

## 2014-11-22 MED ORDER — CHLORHEXIDINE GLUCONATE 0.12% ORAL RINSE (MEDLINE KIT)
15.0000 mL | Freq: Two times a day (BID) | OROMUCOSAL | Status: DC
  Administered 2014-11-22 – 2014-11-29 (×13): 15 mL via OROMUCOSAL

## 2014-11-22 MED ORDER — SODIUM CHLORIDE 0.9 % IV SOLN
25.0000 ug/h | INTRAVENOUS | Status: DC
  Administered 2014-11-22: 400 ug/h via INTRAVENOUS
  Administered 2014-11-22: 50 ug/h via INTRAVENOUS
  Administered 2014-11-23 (×3): 300 ug/h via INTRAVENOUS
  Administered 2014-11-24: 250 ug/h via INTRAVENOUS
  Administered 2014-11-24: 300 ug/h via INTRAVENOUS
  Administered 2014-11-24: 250 ug/h via INTRAVENOUS
  Administered 2014-11-25: 300 ug/h via INTRAVENOUS
  Filled 2014-11-22 (×9): qty 50

## 2014-11-22 MED ORDER — ENOXAPARIN SODIUM 40 MG/0.4ML ~~LOC~~ SOLN
40.0000 mg | SUBCUTANEOUS | Status: DC
  Administered 2014-11-22: 40 mg via SUBCUTANEOUS
  Filled 2014-11-22: qty 0.4

## 2014-11-22 MED ORDER — ACETAMINOPHEN 500 MG PO TABS
1000.0000 mg | ORAL_TABLET | Freq: Once | ORAL | Status: AC
  Administered 2014-11-22: 1000 mg via ORAL
  Filled 2014-11-22: qty 2

## 2014-11-22 MED ORDER — MIDAZOLAM HCL 2 MG/2ML IJ SOLN: 2.0000 mg | Freq: Once | INTRAMUSCULAR | Status: DC

## 2014-11-22 MED ORDER — SODIUM CHLORIDE 0.9 % IV SOLN
1.0000 mg/h | INTRAVENOUS | Status: DC
  Administered 2014-11-22 – 2014-11-23 (×4): 6 mg/h via INTRAVENOUS
  Administered 2014-11-24: 5 mg/h via INTRAVENOUS
  Administered 2014-11-24: 4 mg/h via INTRAVENOUS
  Administered 2014-11-24: 6 mg/h via INTRAVENOUS
  Administered 2014-11-25: 5 mg/h via INTRAVENOUS
  Administered 2014-11-26: 3 mg/h via INTRAVENOUS
  Administered 2014-11-27: 7 mg/h via INTRAVENOUS
  Administered 2014-11-27: 3 mg/h via INTRAVENOUS
  Administered 2014-11-27 – 2014-11-29 (×5): 7 mg/h via INTRAVENOUS
  Filled 2014-11-22 (×20): qty 10

## 2014-11-22 MED ORDER — FENTANYL CITRATE (PF) 100 MCG/2ML IJ SOLN
100.0000 ug | INTRAMUSCULAR | Status: DC | PRN
  Administered 2014-11-22 (×2): 100 ug via INTRAVENOUS
  Filled 2014-11-22 (×2): qty 2

## 2014-11-22 MED ORDER — OXEPA PO LIQD
1000.0000 mL | ORAL | Status: DC
  Administered 2014-11-22 – 2014-11-27 (×6): 1000 mL
  Filled 2014-11-22 (×11): qty 1000

## 2014-11-22 MED ORDER — PIPERACILLIN-TAZOBACTAM 3.375 G IVPB
3.3750 g | Freq: Three times a day (TID) | INTRAVENOUS | Status: DC
  Administered 2014-11-22 – 2014-11-25 (×9): 3.375 g via INTRAVENOUS
  Filled 2014-11-22 (×17): qty 50

## 2014-11-22 MED ORDER — AMLODIPINE BESYLATE 5 MG PO TABS
5.0000 mg | ORAL_TABLET | Freq: Two times a day (BID) | ORAL | Status: DC
  Filled 2014-11-22: qty 1

## 2014-11-22 MED ORDER — FENTANYL BOLUS VIA INFUSION
25.0000 ug | INTRAVENOUS | Status: DC | PRN
  Filled 2014-11-22: qty 25

## 2014-11-22 MED ORDER — ENOXAPARIN SODIUM 80 MG/0.8ML ~~LOC~~ SOLN
80.0000 mg | SUBCUTANEOUS | Status: DC
  Administered 2014-11-23 – 2014-11-24 (×2): 80 mg via SUBCUTANEOUS
  Filled 2014-11-22 (×3): qty 0.8

## 2014-11-22 MED ORDER — SUCCINYLCHOLINE CHLORIDE 20 MG/ML IJ SOLN
INTRAMUSCULAR | Status: AC
  Administered 2014-11-22: 90 mg
  Filled 2014-11-22: qty 1

## 2014-11-22 MED ORDER — CISATRACURIUM BOLUS VIA INFUSION
0.0500 mg/kg | Freq: Once | INTRAVENOUS | Status: AC
  Administered 2014-11-22: 7.8 mg via INTRAVENOUS
  Filled 2014-11-22: qty 8

## 2014-11-22 MED ORDER — ARTIFICIAL TEARS OP OINT
1.0000 "application " | TOPICAL_OINTMENT | Freq: Three times a day (TID) | OPHTHALMIC | Status: DC
  Administered 2014-11-22 – 2014-11-29 (×20): 1 via OPHTHALMIC
  Filled 2014-11-22 (×3): qty 3.5

## 2014-11-22 MED ORDER — POTASSIUM CHLORIDE CRYS ER 20 MEQ PO TBCR
40.0000 meq | EXTENDED_RELEASE_TABLET | Freq: Once | ORAL | Status: AC
  Administered 2014-11-22: 40 meq via ORAL
  Filled 2014-11-22: qty 2

## 2014-11-22 MED ORDER — SODIUM CHLORIDE 0.9 % IV SOLN
1.0000 mg/h | INTRAVENOUS | Status: DC
Start: 2014-11-22 — End: 2014-11-22
  Administered 2014-11-22: 0.5 mg/h via INTRAVENOUS
  Filled 2014-11-22: qty 10

## 2014-11-22 MED ORDER — PIPERACILLIN-TAZOBACTAM 3.375 G IVPB 30 MIN
3.3750 g | Freq: Once | INTRAVENOUS | Status: AC
  Administered 2014-11-22: 3.375 g via INTRAVENOUS
  Filled 2014-11-22: qty 50

## 2014-11-22 MED ORDER — SODIUM CHLORIDE 0.9 % IJ SOLN: 3.0000 mL | INTRAMUSCULAR | Status: DC | PRN

## 2014-11-22 MED ORDER — SODIUM CHLORIDE 0.9 % IJ SOLN
3.0000 mL | Freq: Two times a day (BID) | INTRAMUSCULAR | Status: DC
  Administered 2014-11-22 – 2014-11-29 (×14): 3 mL via INTRAVENOUS

## 2014-11-22 MED ORDER — BENAZEPRIL HCL 10 MG PO TABS
40.0000 mg | ORAL_TABLET | Freq: Every day | ORAL | Status: DC
  Filled 2014-11-22: qty 4

## 2014-11-22 MED ORDER — SUCCINYLCHOLINE CHLORIDE 20 MG/ML IJ SOLN
INTRAMUSCULAR | Status: AC
  Filled 2014-11-22: qty 1

## 2014-11-22 MED ORDER — ETOMIDATE 2 MG/ML IV SOLN
INTRAVENOUS | Status: AC
  Filled 2014-11-22: qty 20

## 2014-11-22 MED ORDER — MIDAZOLAM BOLUS VIA INFUSION
2.0000 mg | INTRAVENOUS | Status: DC | PRN
  Administered 2014-11-27: 2 mg via INTRAVENOUS
  Filled 2014-11-22 (×2): qty 2

## 2014-11-22 MED ORDER — SODIUM CHLORIDE 0.9 % IV SOLN: 250.0000 mL | INTRAVENOUS | Status: DC | PRN

## 2014-11-22 MED ORDER — POTASSIUM CHLORIDE CRYS ER 20 MEQ PO TBCR
40.0000 meq | EXTENDED_RELEASE_TABLET | Freq: Every day | ORAL | Status: DC
  Administered 2014-11-22: 40 meq via ORAL
  Filled 2014-11-22 (×3): qty 2

## 2014-11-22 MED ORDER — ALBUTEROL SULFATE (2.5 MG/3ML) 0.083% IN NEBU
INHALATION_SOLUTION | RESPIRATORY_TRACT | Status: AC
  Filled 2014-11-22: qty 3

## 2014-11-22 MED ORDER — IPRATROPIUM-ALBUTEROL 0.5-2.5 (3) MG/3ML IN SOLN
3.0000 mL | Freq: Once | RESPIRATORY_TRACT | Status: AC
  Administered 2014-11-22: 3 mL via RESPIRATORY_TRACT
  Filled 2014-11-22: qty 3

## 2014-11-22 MED ORDER — VANCOMYCIN HCL 10 G IV SOLR
1500.0000 mg | Freq: Two times a day (BID) | INTRAVENOUS | Status: DC
  Administered 2014-11-22 – 2014-11-24 (×4): 1500 mg via INTRAVENOUS
  Filled 2014-11-22 (×9): qty 1500

## 2014-11-22 MED ORDER — FENTANYL CITRATE (PF) 100 MCG/2ML IJ SOLN: 100.0000 ug | INTRAMUSCULAR | Status: DC | PRN

## 2014-11-22 MED ORDER — ALBUTEROL SULFATE (2.5 MG/3ML) 0.083% IN NEBU
2.5000 mg | INHALATION_SOLUTION | Freq: Once | RESPIRATORY_TRACT | Status: AC
  Administered 2014-11-22: 2.5 mg via RESPIRATORY_TRACT
  Filled 2014-11-22: qty 3

## 2014-11-22 MED ORDER — PROPOFOL 1000 MG/100ML IV EMUL
0.0000 ug/kg/min | INTRAVENOUS | Status: DC
  Administered 2014-11-22: 35 ug/kg/min via INTRAVENOUS
  Administered 2014-11-22: 40 ug/kg/min via INTRAVENOUS
  Filled 2014-11-22: qty 200
  Filled 2014-11-22: qty 100

## 2014-11-22 MED ORDER — FUROSEMIDE 10 MG/ML IJ SOLN
20.0000 mg | Freq: Two times a day (BID) | INTRAMUSCULAR | Status: DC
  Administered 2014-11-22: 20 mg via INTRAVENOUS
  Filled 2014-11-22: qty 2

## 2014-11-22 MED ORDER — FENTANYL CITRATE (PF) 100 MCG/2ML IJ SOLN: 100.0000 ug | Freq: Once | INTRAMUSCULAR | Status: DC | PRN

## 2014-11-22 MED ORDER — PROPOFOL 10 MG/ML IV BOLUS
INTRAVENOUS | Status: DC | PRN
  Administered 2014-11-22: 100 mg via INTRAVENOUS

## 2014-11-22 MED ORDER — FENTANYL BOLUS VIA INFUSION
50.0000 ug | INTRAVENOUS | Status: DC | PRN
  Administered 2014-11-23 – 2014-11-27 (×3): 50 ug via INTRAVENOUS
  Filled 2014-11-22: qty 50

## 2014-11-22 MED ORDER — ROCURONIUM BROMIDE 50 MG/5ML IV SOLN
INTRAVENOUS | Status: AC
  Filled 2014-11-22: qty 2

## 2014-11-22 MED ORDER — VANCOMYCIN HCL 10 G IV SOLR
1500.0000 mg | Freq: Once | INTRAVENOUS | Status: AC
  Administered 2014-11-22: 1500 mg via INTRAVENOUS
  Filled 2014-11-22: qty 1500

## 2014-11-22 MED ORDER — FENOFIBRATE 160 MG PO TABS
160.0000 mg | ORAL_TABLET | Freq: Every day | ORAL | Status: DC
  Filled 2014-11-22 (×3): qty 1

## 2014-11-22 MED ORDER — FENTANYL CITRATE (PF) 100 MCG/2ML IJ SOLN: 50.0000 ug | Freq: Once | INTRAMUSCULAR | Status: AC

## 2014-11-22 MED ORDER — LIDOCAINE HCL (CARDIAC) 20 MG/ML IV SOLN
INTRAVENOUS | Status: AC
  Filled 2014-11-22: qty 5

## 2014-11-22 MED ORDER — PANTOPRAZOLE SODIUM 40 MG IV SOLR
40.0000 mg | Freq: Every day | INTRAVENOUS | Status: DC
  Administered 2014-11-22 – 2014-11-28 (×7): 40 mg via INTRAVENOUS
  Filled 2014-11-22 (×8): qty 40

## 2014-11-22 MED ORDER — IOHEXOL 350 MG/ML SOLN
100.0000 mL | Freq: Once | INTRAVENOUS | Status: AC | PRN
  Administered 2014-11-22: 100 mL via INTRAVENOUS

## 2014-11-22 MED ORDER — MIDAZOLAM HCL 2 MG/2ML IJ SOLN: 2.0000 mg | Freq: Once | INTRAMUSCULAR | Status: DC | PRN

## 2014-11-22 MED ORDER — PRAVASTATIN SODIUM 20 MG PO TABS
20.0000 mg | ORAL_TABLET | Freq: Every evening | ORAL | Status: DC
  Administered 2014-11-22 – 2014-11-28 (×7): 20 mg via ORAL
  Filled 2014-11-22 (×8): qty 1

## 2014-11-22 MED ORDER — SODIUM CHLORIDE 0.9 % IV BOLUS (SEPSIS)
1000.0000 mL | INTRAVENOUS | Status: AC
  Administered 2014-11-22 (×4): 1000 mL via INTRAVENOUS

## 2014-11-22 MED ORDER — ONDANSETRON HCL 4 MG/2ML IJ SOLN: 4.0000 mg | Freq: Four times a day (QID) | INTRAMUSCULAR | Status: DC | PRN

## 2014-11-22 MED ORDER — POTASSIUM CHLORIDE 10 MEQ/100ML IV SOLN
10.0000 meq | INTRAVENOUS | Status: AC
  Administered 2014-11-22 (×2): 10 meq via INTRAVENOUS
  Filled 2014-11-22 (×2): qty 100

## 2014-11-22 MED ORDER — FENTANYL CITRATE (PF) 100 MCG/2ML IJ SOLN: 100.0000 ug | Freq: Once | INTRAMUSCULAR | Status: DC

## 2014-11-22 MED ORDER — IPRATROPIUM-ALBUTEROL 0.5-2.5 (3) MG/3ML IN SOLN
RESPIRATORY_TRACT | Status: AC
  Filled 2014-11-22: qty 3

## 2014-11-22 MED ORDER — ASPIRIN EC 81 MG PO TBEC
81.0000 mg | DELAYED_RELEASE_TABLET | Freq: Every day | ORAL | Status: DC
  Filled 2014-11-22 (×2): qty 1

## 2014-11-22 MED ORDER — ROCURONIUM BROMIDE 50 MG/5ML IV SOLN
100.0000 mg | Freq: Once | INTRAVENOUS | Status: AC
  Administered 2014-11-22: 100 mg via INTRAVENOUS

## 2014-11-22 MED ORDER — ANTISEPTIC ORAL RINSE SOLUTION (CORINZ)
7.0000 mL | Freq: Four times a day (QID) | OROMUCOSAL | Status: DC
  Administered 2014-11-23 – 2014-11-29 (×26): 7 mL via OROMUCOSAL

## 2014-11-22 MED ORDER — VANCOMYCIN HCL IN DEXTROSE 1-5 GM/200ML-% IV SOLN
1000.0000 mg | Freq: Once | INTRAVENOUS | Status: AC
Start: 2014-11-22 — End: 2014-11-22
  Administered 2014-11-22: 1000 mg via INTRAVENOUS
  Filled 2014-11-22: qty 200

## 2014-11-22 MED ORDER — SODIUM CHLORIDE 0.9 % IV BOLUS (SEPSIS)
1000.0000 mL | INTRAVENOUS | Status: AC
  Administered 2014-11-22: 1000 mL via INTRAVENOUS

## 2014-11-22 MED ORDER — SUCCINYLCHOLINE CHLORIDE 20 MG/ML IJ SOLN
INTRAMUSCULAR | Status: DC | PRN
  Administered 2014-11-22: 180 mg via INTRAVENOUS

## 2014-11-22 MED ORDER — ONDANSETRON HCL 4 MG PO TABS: 4.0000 mg | ORAL_TABLET | Freq: Four times a day (QID) | ORAL | Status: DC | PRN

## 2014-11-22 MED ORDER — DOCUSATE SODIUM 50 MG/5ML PO LIQD: 100.0000 mg | Freq: Two times a day (BID) | ORAL | Status: DC | PRN

## 2014-11-22 MED ORDER — POTASSIUM CHLORIDE 10 MEQ/100ML IV SOLN
10.0000 meq | INTRAVENOUS | Status: AC
  Administered 2014-11-22 (×3): 10 meq via INTRAVENOUS
  Filled 2014-11-22 (×2): qty 100

## 2014-11-22 MED ORDER — SODIUM CHLORIDE 0.9 % IV SOLN
3.0000 ug/kg/min | INTRAVENOUS | Status: DC
  Administered 2014-11-22: 3 ug/kg/min via INTRAVENOUS
  Administered 2014-11-23 – 2014-11-24 (×5): 1.5 ug/kg/min via INTRAVENOUS
  Administered 2014-11-25: 2 ug/kg/min via INTRAVENOUS
  Administered 2014-11-26 – 2014-11-27 (×3): 1.5 ug/kg/min via INTRAVENOUS
  Administered 2014-11-27: 1 ug/kg/min via INTRAVENOUS
  Administered 2014-11-27 – 2014-11-29 (×4): 2 ug/kg/min via INTRAVENOUS
  Filled 2014-11-22 (×14): qty 20

## 2014-11-22 MED ORDER — DOXAZOSIN MESYLATE 2 MG PO TABS
2.0000 mg | ORAL_TABLET | Freq: Every day | ORAL | Status: DC
  Filled 2014-11-22: qty 1

## 2014-11-22 MED ORDER — SODIUM CHLORIDE 0.9 % IV SOLN
INTRAVENOUS | Status: DC | PRN
  Administered 2014-11-22: 10:00:00 via INTRAVENOUS

## 2014-11-22 MED ORDER — SODIUM CHLORIDE 0.9 % IV SOLN: INTRAVENOUS | Status: DC

## 2014-11-22 MED ORDER — INSULIN ASPART 100 UNIT/ML ~~LOC~~ SOLN
0.0000 [IU] | Freq: Three times a day (TID) | SUBCUTANEOUS | Status: DC
  Administered 2014-11-22: 2 [IU] via SUBCUTANEOUS
  Administered 2014-11-22: 3 [IU] via SUBCUTANEOUS

## 2014-11-22 NOTE — ED Notes (Signed)
Fever started Monday, off and on through the week, states tonight wife sprayed some acrylic spray in the house and then pt became sob and has been coughing.  Pt denies cp

## 2014-11-22 NOTE — Progress Notes (Addendum)
[facility] PROGRESS NOTE  COHL BEHRENS ZOX:[id number] DOB: [birth year] DOA: 11/18/2014 PCP: Lubertha South, MD  Assessment/Plan:  Multifocal PNA - continue IV Vancomycin and Zosyn - follow up blood and sputum cultures  Acute hypoxemic respiratory failure - currently on BIPAP, repeat ABG reveals a pO2 of 80 on FIO2 90 and has significant work of breathing - will intubate - consulted critical care for transfer to [facility] for continued ventilator care given absence of pulmonary coverage at [facility] this weekend.  Sepsis 2/2 PNA - Lactic acid 2.18. WBC wnl. Febrile to 101.3 - follow up blood culture - continue IV abx  Insulin dependent DM - stable. Continue SSI  Hypokalemia - will replete and monitor.   Hyperlipidemia -continue statin   Code Status: Full Family Communication: Wife and family members at bedside. Discussed plan with them and the patient. All questions answered.   Disposition Plan: Transfer to [facility]   Consultants:  Critical care team- Dr. Marchelle Gearing  Antibiotics:  Vancomycin  Zosyn  Subjective: Per wife at bedside, patient has had a fever for the last week with a cough and significant SOB since last night. Patient complains of SOB and is requesting water.   Objective: Filed Vitals:   11/11/2014 0600 11/07/2014 0704 11/21/2014 0745 11/14/2014 0812  BP: 114/67     Pulse: 95 113  103  Temp:   101.3 F (38.5 C)   TempSrc:   Axillary   Resp: 24 41  31  Height:      Weight:      SpO2: 97% 100%  100%   No intake or output data in the 24 hours ending 11/16/2014 0837 Filed Weights   11/21/2014 0159  Weight: 156.491 kg (345 lb)    Exam:   General:  AA Ox3  Cardiovascular: tachycardic, regular rhythm   Respiratory: Crackles throughout, increased respiratory effort. Decreased breath sounds at the right base  Abdomen:  S/NT/ND/ +BS / obese  Extremities: no C/C. Chronic venous insufficiency changes. 3/4+ BLE edema.   Neurologic:  Grossly intact and  non-focal  Data Reviewed: Basic Metabolic Panel:  Recent Labs Lab 11/12/2014 0215  NA 131*  K 3.1*  CL 97*  CO2 23  GLUCOSE 236*  BUN 17  CREATININE 0.70  CALCIUM 9.0   Liver Function Tests:  Recent Labs Lab 11/04/2014 0215  AST 72*  ALT 70*  ALKPHOS 123  BILITOT 2.3*  PROT 6.3*  ALBUMIN 2.9*    CBC:  Recent Labs Lab 12/01/2014 0215  WBC 8.5  NEUTROABS 7.5  HGB 13.5  HCT 37.9*  MCV 80.6  PLT 132*    BNP (last 3 results)  Recent Labs  11/16/2014 0215 11/09/2014 0720  BNP 61.0 64.0      CBG:  Recent Labs Lab 11/23/2014 0740  GLUCAP 206*    Recent Results (from the past 240 hour(s))  Blood Culture (routine x 2)     Status: None (Preliminary result)   Collection Time: 11/23/2014  2:15 AM  Result Value Ref Range Status   Specimen Description RIGHT ANTECUBITAL  Final   Special Requests BOTTLES DRAWN AEROBIC ONLY 6CC DRAWN BY RN  Final   Culture NO GROWTH < 12 HOURS  Final   Report Status PENDING  Incomplete  Blood Culture (routine x 2)     Status: None (Preliminary result)   Collection Time: 11/27/2014  2:41 AM  Result Value Ref Range Status   Specimen Description BLOOD RIGHT HAND  Final   Special Requests BOTTLES  DRAWN AEROBIC AND ANAEROBIC 7CC  Final   Culture NO GROWTH < 12 HOURS  Final   Report Status PENDING  Incomplete     Studies: Ct Angio Chest Pe W/cm &/or Wo Cm  11/06/2014  CLINICAL DATA:  Acute onset of fever, dry cough and shortness of breath. Initial encounter. EXAM: CT ANGIOGRAPHY CHEST WITH CONTRAST TECHNIQUE: Multidetector CT imaging of the chest was performed using the standard protocol during bolus administration of intravenous contrast. Multiplanar CT image reconstructions and MIPs were obtained to evaluate the vascular anatomy. CONTRAST:  100 mL of Omnipaque 350 IV contrast COMPARISON:  Chest radiograph performed earlier today at 2:32 a.m. FINDINGS: There is no evidence of central pulmonary embolus. Evaluation for pulmonary embolus is  markedly suboptimal due to limitations in the timing of the contrast bolus and extensive airspace consolidation. Diffuse bilateral central airspace opacification is noted, worse on the right, with associated air bronchograms. This is suspicious for diffuse bilateral pneumonia. A small right pleural effusion is noted. Given the extent of disease, ARDS is a concern. There is no evidence of pneumothorax. No obvious masses are identified; no abnormal focal contrast enhancement is seen. Diffuse coronary artery calcifications are seen. Enlarged mediastinal nodes are seen, measuring up to 1.6 cm, at the right paratracheal, periaortic and azygoesophageal recess regions. No pericardial effusion is identified. The mediastinum is difficult to fully assess due to the patient's habitus, but appears otherwise unremarkable. No axillary lymphadenopathy is seen. The visualized portions of the thyroid gland are unremarkable in appearance. The visualized portions of the liver and spleen are unremarkable. No acute osseous abnormalities are seen. There is a healing minimally displaced fracture through the right lateral sixth rib. Review of the MIP images confirms the above findings. IMPRESSION: 1. No evidence of central pulmonary embolus. 2. Diffuse bilateral central airspace opacification, worse on the right, with associated air bronchograms. Given the patient's symptoms, this is suspicious for diffuse bilateral pneumonia. Small right pleural effusion noted. Given the extent of disease, ARDS is a concern. 3. Diffuse coronary artery calcifications seen. 4. Enlarged mediastinal nodes likely reflect the acute infectious process. 5. Healing minimally displaced fracture through the right lateral sixth rib. Electronically Signed   By: Roanna RaiderJeffery  Chang M.D.   On: 11/28/2014 05:03   Dg Chest Port 1 View  11/18/2014  CLINICAL DATA:  Code sepsis. Fever, shortness breath and cough, acute onset. Initial encounter. EXAM: PORTABLE CHEST 1 VIEW  COMPARISON:  Chest radiograph performed 07/19/2013 FINDINGS: The lungs are well-aerated. Diffuse bilateral airspace opacities raise concern for multifocal pneumonia, given the patient's symptoms. Small bilateral pleural effusions are suspected. No pneumothorax is seen. The cardiomediastinal silhouette is enlarged. No acute osseous abnormalities are identified. IMPRESSION: Diffuse bilateral airspace opacities raise concern for multifocal pneumonia, given the patient's symptoms. Small bilateral pleural effusions suspected. Cardiomegaly. Electronically Signed   By: Roanna RaiderJeffery  Chang M.D.   On: 11/26/2014 02:44    Scheduled Meds: . amLODipine  5 mg Oral BID  . aspirin EC  81 mg Oral Daily  . benazepril  40 mg Oral Daily  . doxazosin  2 mg Oral Daily  . enoxaparin (LOVENOX) injection  40 mg Subcutaneous Q24H  . fenofibrate  160 mg Oral Daily  . furosemide  20 mg Intravenous Q12H  . insulin aspart  0-9 Units Subcutaneous TID WC  . insulin glargine  60 Units Subcutaneous BID  . piperacillin-tazobactam (ZOSYN)  IV  3.375 g Intravenous Q8H  . potassium chloride  10 mEq Intravenous Q1 Hr x  3  . potassium chloride SA  40 mEq Oral Daily  . pravastatin  20 mg Oral QPM  . sodium chloride  3 mL Intravenous Q12H  . vancomycin  1,500 mg Intravenous Q12H   Continuous Infusions:   Active Problems:   Essential hypertension, benign   Diabetes (HCC)   Venous stasis   CAP (community acquired pneumonia)   Pneumonia    Critical care; Time spent: 45 minutes. Greater than 50% of this time was spent in direct contact with the patient coordinating care. Time in: 9:00 am Time out: 9:45 am  Peggye Pitt, MD.   [facility] Pager [phone]  If 7PM-7AM, please contact night-coverage at [email or web address], password [facility] 12-06-14, 8:37 AM  LOS: 0 days      By signing my name below, I, Burnett Harry, attest that this documentation has been prepared under the direction and in the presence of Peggye Pitt, MD.  Electronically Signed: Burnett Harry, Scribe. 12-06-14 9:19am   I have reviewed the above documentation for accuracy and completeness, and I agree with the above.  Peggye Pitt, MD [facility] Pager: [phone]

## 2014-11-22 NOTE — Progress Notes (Signed)
[facility]. Pt on ARDS protocol currently and will continue as previously ordered. Pt with tachypnea (RR >40) with SPO2 85%. Fio2 100%, Peep 20. CCM NP B. Ollis aware at this time. RT will continue to monitor.

## 2014-11-22 NOTE — Transfer of Care (Signed)
Immediate Anesthesia Transfer of Care Note  Patient: Brandon KettleRobert F Villarreal  Procedure(s) Performed: * No procedures listed *  Patient Location: ICU  Anesthesia Type:General  Level of Consciousness: responds to stimulation  Airway & Oxygen Therapy: Patient placed on Ventilator (see vital sign flow sheet for setting)  Post-op Assessment: Report given to RN, Post -op Vital signs reviewed and stable and Patient moving all extremities  Post vital signs: Reviewed and stable  Last Vitals:  Filed Vitals:   11/15/2014 0917  BP: 172/90  Pulse:   Temp:   Resp:     Complications: No apparent anesthesia complications

## 2014-11-22 NOTE — Progress Notes (Signed)
Pt transferred via [facility] to [id number].  Pt's family aware of transfer and they took possession of his belongings.  Report called to receiving RN.

## 2014-11-22 NOTE — Progress Notes (Signed)
ANTIBIOTIC CONSULT NOTE-  Pharmacy Consult for Vancomycin and Zosyn Indication: Sepsis  No Known Allergies  Patient Measurements: Height: 6\' 3"  (190.5 cm) Weight: (!) 345 lb (156.491 kg) IBW/kg (Calculated) : 84.5  Vital Signs: Temp: 101.3 F (38.5 C) (10/22 0745) Temp Source: Axillary (10/22 0745) BP: 114/67 mmHg (10/22 0600) Pulse Rate: 103 (10/22 0812)  Labs:  Recent Labs  06-04-14 0215  WBC 8.5  HGB 13.5  PLT 132*  CREATININE 0.70    Estimated Creatinine Clearance: 151.5 mL/min (by C-G formula based on Cr of 0.7).  No results for input(s): VANCOTROUGH, VANCOPEAK, VANCORANDOM, GENTTROUGH, GENTPEAK, GENTRANDOM, TOBRATROUGH, TOBRAPEAK, TOBRARND, AMIKACINPEAK, AMIKACINTROU, AMIKACIN in the last 72 hours.   Microbiology: Recent Results (from the past 720 hour(s))  Blood Culture (routine x 2)     Status: None (Preliminary result)   Collection Time: 06-04-14  2:15 AM  Result Value Ref Range Status   Specimen Description RIGHT ANTECUBITAL  Final   Special Requests BOTTLES DRAWN AEROBIC ONLY 6CC DRAWN BY RN  Final   Culture NO GROWTH < 12 HOURS  Final   Report Status PENDING  Incomplete  Blood Culture (routine x 2)     Status: None (Preliminary result)   Collection Time: 06-04-14  2:41 AM  Result Value Ref Range Status   Specimen Description BLOOD RIGHT HAND  Final   Special Requests BOTTLES DRAWN AEROBIC AND ANAEROBIC 7CC  Final   Culture NO GROWTH < 12 HOURS  Final   Report Status PENDING  Incomplete    Medical History: Past Medical History  Diagnosis Date  . Hypertension   . Hyperlipidemia   . ED (erectile dysfunction)   . Obesity   . Sensory neuropathy (HCC)   . Microproteinuria   . Venous stasis   . Diabetes mellitus     fasting 90-120  . Arthritis     Medications:  Vancomycin 1 Gm IV in the ED at 0215 Zosyn 3.375 Gm IV at 0215 Additional Vancomycin 1500 mg IV given 0513 for total loading dose Vancomycin 2500 mg  Assessment: 64 yo male with  symptoms of fever, cough, SOB. WBCs normal; elevated D-dimer. Ct shows evidence of multifocal pneumonia.  R/o PE. Blood cultures pending. Pt meets SIRS criteria for broad-spectrum antibiotics. Zosyn and Vancomycin given in the ED. Calculated normalized CrCl 109 ml/min  Goal of Therapy:  Vancomycin troughs 15-20 mcg/ml Eradicate infection  Plan:  Zosyn 3.375 GM IV every 8 hours, each dose over 4 hours Vancomycin 1500 mg IV every 12 hours Vancomycin trough at steady state Monitor renal function Labs per protocol   Raquel JamesPittman, [place] Silveria WentworthBennett, RPH 09-17-2014,8:52 AM

## 2014-11-22 NOTE — H&P (Signed)
PCP:   Mickie Hillier, MD   Chief Complaint:  Shortness of breath  HPI:  64 year old  male who  has a past medical history of Hypertension; Hyperlipidemia; ED (erectile dysfunction); Obesity; Sensory neuropathy ([place]); Microproteinuria; Venous stasis; Diabetes mellitus; and Arthritis. Today presents to the ED with complaints of fever for past 4 days along with dry cough and shortness of breath. Patient's breathing became worse last night after patient's life for spring Christmas ornaments  with acrylic spray. Patient does not have a history of COPD he does take Lasix for leg edema. Patient denies any chest pain, no nausea vomiting or diarrhea. Complains of mild dysuria.  In the ED patient was hypoxic and required BiPAP. D-dimer was elevated to 6.12, CT angiogram done is negative for pulmonary embolism. Chest x-ray and CT chest showed bilateral diffuse infiltrates concerning for pneumonia. Patient started on vancomycin and Zosyn.  Allergies:  No Known Allergies    Past Medical History  Diagnosis Date  . Hypertension   . Hyperlipidemia   . ED (erectile dysfunction)   . Obesity   . Sensory neuropathy ([place])   . Microproteinuria   . Venous stasis   . Diabetes mellitus     fasting 90-120  . Arthritis     Past Surgical History  Procedure Laterality Date  . Replacement total knee      left  . Back surgery      lumbar disc  . Hammer toe surgery  02/10/2011    Procedure: HAMMER TOE CORRECTION;  Surgeon: Marcheta Grammes;  Location: [facility];  Service: Orthopedics;  Laterality: Right;  Percutaneous Flexor Tenotomy third Toe Right Foot  . Joint replacement Left     knee  . Hernia repair      umbilical  . 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 Authorizing Provider  amLODipine  (NORVASC) 10 MG tablet Take 0.5 tablets (5 mg total) by mouth 2 (two) times daily. 06/11/14  Yes Mikey Kirschner, MD  aspirin 81 MG tablet Take 81 mg by mouth daily.   Yes Historical Provider, MD  benazepril (LOTENSIN) 40 MG tablet Take 1 tablet (40 mg total) by mouth daily. 06/11/14  Yes Mikey Kirschner, MD  blood glucose meter kit and supplies KIT Test blood sugar once daily as directed for diabetes type 2. Code E11.9. 02/26/14  Yes Mikey Kirschner, MD  doxazosin (CARDURA) 2 MG tablet Take 1 tablet (2 mg total) by mouth daily. 06/11/14  Yes Mikey Kirschner, MD  fenofibrate 160 MG tablet Take 1 tablet (160 mg total) by mouth daily. 06/11/14  Yes Mikey Kirschner, MD  furosemide (LASIX) 40 MG tablet Take 1 tablet (40 mg total) by mouth daily. 06/11/14  Yes Mikey Kirschner, MD  glyBURIDE-metformin (GLUCOVANCE) 5-500 MG per tablet Take 2 tablets by mouth 2 (two) times daily with a meal. 06/11/14  Yes Mikey Kirschner, MD  ibuprofen (ADVIL,MOTRIN) 200 MG tablet Take 400 mg by mouth every 6 (six) hours as needed for moderate pain.   Yes Historical Provider, MD  insulin glargine (LANTUS) 100 UNIT/ML injection Inject 0.6 mLs (60 Units total) into the skin 2 (two) times daily. 06/11/14  Yes Mikey Kirschner, MD  potassium chloride SA (K-DUR,KLOR-CON) 20 MEQ tablet TAKE ONE  TABLET BY MOUTH EVERY DAY 06/11/14  Yes Mikey Kirschner, MD  pravastatin (PRAVACHOL) 20 MG tablet Take 1 tablet (20 mg total) by mouth every evening. 06/11/14 06/11/15 Yes Mikey Kirschner, MD    Social History:  reports that he has never smoked. He does not have any smokeless tobacco history on file. He reports that he does not drink alcohol or use illicit drugs.  Family History  Problem Relation Age of Onset  . Anesthesia problems Neg Hx   . Hypotension Neg Hx   . Malignant hyperthermia Neg Hx   . Pseudochol deficiency Neg Hx     Filed Weights   11/09/2014 0159  Weight: 156.491 kg (345 lb)    All the positives are listed in  BOLD  Review of Systems:  HEENT: Headache, blurred vision, runny nose, sore throat Neck: Hypothyroidism, hyperthyroidism,,lymphadenopathy Chest : Shortness of breath, history of COPD, Asthma Heart : Chest pain, history of coronary arterey disease GI:  Nausea, vomiting, diarrhea, constipation, GERD GU: Dysuria, urgency, frequency of urination, hematuria Neuro: Stroke, seizures, syncope Psych: Depression, anxiety, hallucinations   Physical Exam: Blood pressure 124/71, pulse 108, temperature 100.1 F (37.8 C), temperature source Rectal, resp. rate 28, height 6' 3"  (1.905 m), weight 156.491 kg (345 lb), SpO2 95 %. Constitutional:   Patient is a well-developed and well-nourished male in no acute distress and cooperative with exam. Head: Normocephalic and atraumatic Mouth: Mucus membranes moist Eyes: PERRL, EOMI, conjunctivae normal Neck: Supple, No Thyromegaly Cardiovascular: Regular in rate and rhythm Respiratory- bibasilar crackles Abdominal: Soft. Non-tender, non-distended, bowel sounds are normal, no masses, organomegaly, or guarding present.  Neurological: A&O x3, Strength is normal and symmetric bilaterally, cranial nerve II-XII are grossly intact, no focal motor deficit, sensory intact to light touch bilaterally.  Extremities : Chronic venous changes in the lower extremities, bilateral 2+ pitting edema  Labs on Admission:  Basic Metabolic Panel:  Recent Labs Lab 11/06/2014 0215  NA 131*  K 3.1*  CL 97*  CO2 23  GLUCOSE 236*  BUN 17  CREATININE 0.70  CALCIUM 9.0   Liver Function Tests:  Recent Labs Lab 11/27/2014 0215  AST 72*  ALT 70*  ALKPHOS 123  BILITOT 2.3*  PROT 6.3*  ALBUMIN 2.9*   No results for input(s): LIPASE, AMYLASE in the last 168 hours. No results for input(s): AMMONIA in the last 168 hours. CBC:  Recent Labs Lab 11/21/2014 0215  WBC 8.5  NEUTROABS 7.5  HGB 13.5  HCT 37.9*  MCV 80.6  PLT 132*   Cardiac Enzymes: No results for input(s):  CKTOTAL, CKMB, CKMBINDEX, TROPONINI in the last 168 hours.  BNP (last 3 results)  Recent Labs  11/10/2014 0215  BNP 61.0     Radiological Exams on Admission: Dg Chest Port 1 View  11/19/2014  CLINICAL DATA:  Code sepsis. Fever, shortness breath and cough, acute onset. Initial encounter. EXAM: PORTABLE CHEST 1 VIEW COMPARISON:  Chest radiograph performed 07/19/2013 FINDINGS: The lungs are well-aerated. Diffuse bilateral airspace opacities raise concern for multifocal pneumonia, given the patient's symptoms. Small bilateral pleural effusions are suspected. No pneumothorax is seen. The cardiomediastinal silhouette is enlarged. No acute osseous abnormalities are identified. IMPRESSION: Diffuse bilateral airspace opacities raise concern for multifocal pneumonia, given the patient's symptoms. Small bilateral pleural effusions suspected. Cardiomegaly. Electronically Signed   By: Garald Balding M.D.   On: 11/24/2014 02:44    EKG: Independently reviewed. Sinus tachycardia   Assessment/Plan Active Problems:   Essential hypertension, benign   Diabetes (  [place])   Venous stasis   CAP (community acquired pneumonia)   Pneumonia  Multifocal pneumonia CT chest confirms multifocal pneumonia, patient started on vancomycin and Zosyn per pharmacy consultation. Follow blood culture results. Will obtain urine for Legionella antigen as well as strep pneumo urinary antigen.  Bilateral lower extremity edema Patient has bilateral lower extremity edema from venous stasis. EKG shows sinus tachycardia with LVH. Will obtain echocardiogram to rule out CHF. Check BNP. Patient takes Lasix at home. Will start Lasix 20 mg IV every 12 hours.  Hypokalemia Replace potassium and check BMP in a.m.  Diabetes mellitus Continue with Lantus 60 units twice a day, start sliding scale insulin with NovoLog.  DVT prophylaxis Lovenox  Code status: Full code  Family discussion: Admission, patients condition and plan of care  including tests being ordered have been discussed with the patient and his wife and daughter at bedside who indicate understanding and agree with the plan and Code Status.   Time Spent on Admission: 60 min  [facility] Pager: [phone] 11/07/2014, 4:58 AM  If 7PM-7AM, please contact night-coverage  [email or web address]  Password [email or web address]

## 2014-11-22 NOTE — Progress Notes (Signed)
Started patient on BiPAP because of increased breathing f35, FiO2 low on NRB m 92-93.

## 2014-11-22 NOTE — Procedures (Signed)
Arterial Catheter Insertion Procedure Note Brandon Villarreal [id number] 07/07/1950  Procedure: Insertion of Arterial Catheter  Indications: Blood pressure monitoring and Frequent blood sampling  Procedure Details Consent: Unable to obtain consent because of emergent medical necessity. Time Out: Verified patient identification, verified procedure, site/side was marked, verified correct patient position, special equipment/implants available, medications/allergies/relevent history reviewed, required imaging and test results available.  Performed  Maximum sterile technique was used including antiseptics, cap, gloves, gown, hand hygiene, mask and sheet. Skin prep: Chlorhexidine; local anesthetic administered 20 gauge catheter was inserted into right radial artery using the Seldinger technique.  Evaluation Blood flow good; BP tracing good. Complications: No apparent complications  Arterial catheter was placed in patient's right radial artery with no apparent complications. RT will continue to monitor.    Brandon BimlerAlysha Jessica Villarreal Brandon Villarreal 06/28/14

## 2014-11-22 NOTE — ED Provider Notes (Addendum)
TIME SEEN: 2:00 AM  CHIEF COMPLAINT: Fever, shortness of breath, cough  HPI: Pt is a 64 y.o. male with history of obesity, hypertension, hyperlipidemia, diabetes who presents to the emergency department with complaints of fever for the past 4-5 days, dry cough and shortness of breath. Shortness of breath became acutely worse tonight after his wife was spraying Christmas ornaments with an acrylic spray. Denies history of COPD or asthma. No history of CHF. No sick contacts or recent travel. Has not been hospitalized recently. No prior history of tobacco use. Does not wear oxygen at home. Has not had influenza vaccination this year. No prior history of PE or DVT. Has chronic unchanged lower extremity swelling from venous stasis. Denies any chest pain or chest discomfort. No vomiting or diarrhea. No headache, neck pain or neck stiffness. Does have some mild sore throat from coughing. No ear pain. No rash. Fevers have been 102 at home.  PCP is Luking  Pt reports he is a DNR/DNI.  ROS: See HPI Constitutional:  fever  Eyes: no drainage  ENT: no runny nose   Cardiovascular:  no chest pain  Resp:  SOB  GI: no vomiting GU: no dysuria Integumentary: no rash  Allergy: no hives  Musculoskeletal: no leg swelling  Neurological: no slurred speech ROS otherwise negative  PAST MEDICAL HISTORY/PAST SURGICAL HISTORY:  Past Medical History  Diagnosis Date  . Hypertension   . Hyperlipidemia   . ED (erectile dysfunction)   . Obesity   . 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 Microproteinuria   . Venous stasis   . Diabetes mellitus     fasting 90-120  . Arthritis     MEDICATIONS:  Prior to Admission medications   Medication Sig Start Date End Date Taking? Authorizing Provider  amLODipine (NORVASC) 10 MG tablet Take 0.5 tablets (5 mg total) by mouth 2 (two) times daily. 06/11/14  Yes Mikey Kirschner, MD  aspirin 81 MG tablet Take 81 mg by mouth daily.   Yes Historical Provider, MD  benazepril (LOTENSIN) 40  MG tablet Take 1 tablet (40 mg total) by mouth daily. 06/11/14  Yes Mikey Kirschner, MD  blood glucose meter kit and supplies KIT Test blood sugar once daily as directed for diabetes type 2. Code E11.9. 02/26/14  Yes Mikey Kirschner, MD  doxazosin (CARDURA) 2 MG tablet Take 1 tablet (2 mg total) by mouth daily. 06/11/14  Yes Mikey Kirschner, MD  fenofibrate 160 MG tablet Take 1 tablet (160 mg total) by mouth daily. 06/11/14  Yes Mikey Kirschner, MD  furosemide (LASIX) 40 MG tablet Take 1 tablet (40 mg total) by mouth daily. 06/11/14  Yes Mikey Kirschner, MD  glyBURIDE-metformin (GLUCOVANCE) 5-500 MG per tablet Take 2 tablets by mouth 2 (two) times daily with a meal. 06/11/14  Yes Mikey Kirschner, MD  ibuprofen (ADVIL,MOTRIN) 200 MG tablet Take 400 mg by mouth every 6 (six) hours as needed for moderate pain.   Yes Historical Provider, MD  insulin glargine (LANTUS) 100 UNIT/ML injection Inject 0.6 mLs (60 Units total) into the skin 2 (two) times daily. 06/11/14  Yes Mikey Kirschner, MD  potassium chloride SA (K-DUR,KLOR-CON) 20 MEQ tablet TAKE ONE TABLET BY MOUTH EVERY DAY 06/11/14  Yes Mikey Kirschner, MD  pravastatin (PRAVACHOL) 20 MG tablet Take 1 tablet (20 mg total) by mouth every evening. 06/11/14 06/11/15 Yes Mikey Kirschner, MD    ALLERGIES:  No Known Allergies  SOCIAL HISTORY:  Social History  Substance Use Topics  . Smoking status: Never Smoker   . Smokeless tobacco: Not on file  . Alcohol Use: No    FAMILY HISTORY: Family History  Problem Relation Age of Onset  . Anesthesia problems Neg Hx   . Hypotension Neg Hx   . Malignant hyperthermia Neg Hx   . Pseudochol deficiency Neg Hx     EXAM: BP 152/88 mmHg  Pulse 123  Temp(Src) 99 F (37.2 C) (Oral)  Resp 23  Ht 6' 3"  (1.905 m)  Wt 345 lb (156.491 kg)  BMI 43.12 kg/m2  SpO2 90% CONSTITUTIONAL: Alert and oriented and responds appropriately to questions. Is in moderate respiratory distress. Appears uncomfortable. Patient  is febrile. Nontoxic appearing. HEAD: Normocephalic EYES: Conjunctivae clear, PERRL ENT: normal nose; no rhinorrhea; moist mucous membranes; pharynx without lesions noted NECK: Supple, no meningismus, no LAD  CARD: Regular and tachycardic; S1 and S2 appreciated; no murmurs, no clicks, no rubs, no gallops RESP: Patient is tachypneic and hypoxic, diminished breath sounds bilaterally with mild expiratory wheezing scattered diffusely, no rhonchi or rales, moderate respiratory distress, speaking short sentences ABD/GI: Normal bowel sounds; slightly distended without tympany or fluid wave; soft, non-tender, no rebound, no guarding, no peritoneal signs BACK:  The back appears normal and is non-tender to palpation, there is no CVA tenderness EXT: Normal ROM in all joints; non-tender to palpation; nonpitting bilateral lower extremity edema to the midcalf with signs of venous stasis dermatitis bilaterally; normal capillary refill; no cyanosis, no calf tenderness or swelling    SKIN: Normal color for age and race; warm, no rash or sign of cellulitis NEURO: Moves all extremities equally, sensation to light touch intact diffusely, cranial nerves II through XII intact PSYCH: The patient's mood and manner are appropriate. Grooming and personal hygiene are appropriate.  MEDICAL DECISION MAKING: Patient here with respiratory distress, fever, cough. Differential diagnosis includes pneumonia, pulmonary embolus, CHF exacerbation, ARDS. Given patient meets SIRS criteria, will give broad-spectrum MiraLAX and IV fluids. Will obtain labs, cultures, chest x-ray, urine. Will also obtain d-dimer and troponin. EKG shows sinus tachycardia with signs of LVH. Patient will need admission. He has a new oxygen requirement.  ED PROGRESS: Patient has no leukocytosis. Lactate is mildly elevated. Troponin negative. D-dimer significantly elevated. Chest x-ray shows what appears to be multifocal pneumonia. We'll obtain a CT of his chest as  well to evaluate for possible pulmonary embolus. He is receiving vancomycin and Zosyn. Patient is improving on a nonrebreather.   4:15 AM  D/w Dr. Darrick Meigs for admission to step down. Updated patient and family.   5:15 AM  Pt now full code. CT scan shows no pulmonary embolus but he does have diffuse bilateral central airspace opacification worse on the right concerning for diffuse bilateral pneumonia, ARDS. Patient awaiting step down bed. He is improving but is still quite ill. Respiratory rate, tachycardia or improving. He is still normotensive.  Sepsis - Repeat Assessment  Performed at:    3:30 AM  Vitals     Blood pressure 127/78, pulse 118, temperature 100.1 F (37.8 C), temperature source Rectal, resp. rate 27, height 6' 3"  (1.905 m), weight 345 lb (156.491 kg), SpO2 98 %.  Heart:     Tachycardic  Lungs:    Wheezing, Diminished  Capillary Refill:   <2 sec  Peripheral Pulse:   Radial pulse palpable  Skin:     Dry, normal color    Sepsis - Repeat Assessment  Performed at:    5:15 AM  Vitals  Blood pressure 124/71, pulse 108, temperature 100.1 F (37.8 C), temperature source Rectal, resp. rate 28, height 6' 3"  (1.905 m), weight 345 lb (156.491 kg), SpO2 95 %.  Heart:     Tachycardic  Lungs:    Diminished, wheezing  Capillary Refill:   <2 sec  Peripheral Pulse:   Radial pulse palpable  Skin:     Normal Color, cry     EKG Interpretation  Date/Time:  Saturday November 22 2014 01:56:18 EDT Ventricular Rate:  123 PR Interval:  153 QRS Duration: 124 QT Interval:  318 QTC Calculation: 455 R Axis:   -45 Text Interpretation:  Sinus tachycardia Nonspecific IVCD with LAD LVH with secondary repolarization abnormality Anterior Q waves, possibly due to LVH Baseline wander in lead(s) V4 No significant change since last tracing other than rate is faster and LVH more pronounced Confirmed by Jalaysia Lobb,  DO, Merin Borjon ([id number]) on 11/28/2014 2:03:12 AM       CRITICAL CARE Performed  by: Nyra Jabs   Total critical care time: 45 minutes  Critical care time was exclusive of separately billable procedures and treating other patients.  Critical care was necessary to treat or prevent imminent or life-threatening deterioration.  Critical care was time spent personally by me on the following activities: development of treatment plan with patient and/or surrogate as well as nursing, discussions with consultants, evaluation of patient's response to treatment, examination of patient, obtaining history from patient or surrogate, ordering and performing treatments and interventions, ordering and review of laboratory studies, ordering and review of radiographic studies, pulse oximetry and re-evaluation of patient's condition.   [place], DO 11/14/2014 [employer] Osmany Azer, DO 11/13/2014 0514   6:35 AM  Pt has been on BiPAP for the past hour and has been improving significantly. Respiratory rate heart rate now within normal limits. He is able to speak full sentences and reports feeling much better. Blood pressure is still stable.  [place], DO 11/03/2014 [phone]

## 2014-11-22 NOTE — Anesthesia Procedure Notes (Signed)
Procedure Name: Intubation Date/Time: 11/02/2014 10:26 AM Performed by: Despina HiddenIDACAVAGE, Yan J Pre-anesthesia Checklist: Patient identified, Emergency Drugs available, Timeout performed, Suction available and Patient being monitored Patient Re-evaluated:Patient Re-evaluated prior to inductionOxygen Delivery Method: Circle system utilized Preoxygenation: Pre-oxygenation with 100% oxygen Intubation Type: IV induction, Rapid sequence and Cricoid Pressure applied Ventilation: Mask ventilation without difficulty Laryngoscope Size: Mac and 3 Grade View: Grade II Tube type: Subglottic suction tube Tube size: 8.0 mm Number of attempts: 1 Airway Equipment and Method: Stylet Placement Confirmation: ETT inserted through vocal cords under direct vision,  positive ETCO2,  breath sounds checked- equal and bilateral and CO2 detector Secured at: 22 cm Tube secured with: Tape Dental Injury: Teeth and Oropharynx as per pre-operative assessment

## 2014-11-22 NOTE — Progress Notes (Signed)
Patient changed to CPAP 10, doing better.

## 2014-11-22 NOTE — Procedures (Signed)
Central Venous Catheter Insertion Procedure Note Brandon KettleRobert F Villarreal [id number] 02/24/1950  Procedure: Insertion of Central Venous Catheter Indications: Assessment of intravascular volume, Drug and/or fluid administration and Frequent blood sampling  Procedure Details Consent: Risks of procedure as well as the alternatives and risks of each were explained to the (patient/caregiver).  Consent for procedure obtained. Time Out: Verified patient identification, verified procedure, site/side was marked, verified correct patient position, special equipment/implants available, medications/allergies/relevent history reviewed, required imaging and test results available.  Performed  Maximum sterile technique was used including antiseptics, cap, gloves, gown, hand hygiene, mask and sheet. Skin prep: Chlorhexidine; local anesthetic administered A antimicrobial bonded/coated triple lumen catheter was placed in the left femoral vein due to multiple attempts, no other available access using the Seldinger technique.  Evaluation Blood flow good Complications: No apparent complications Patient did tolerate procedure well. Chest X-ray ordered to verify placement.  CXR: pending.   Procedure performed under direct supervision of Dr. Marchelle Gearingamaswamy and with ultrasound guidance for real time vessel cannulation.     Attempted R & L IJ and unable to pass wire on both sides.  CXR pending.   Brandon BrimBrandi Rutherford Alarie, NP-C [facility] Pgr: [phone] or if no answer [phone] 11/19/2014, 6:19 PM

## 2014-11-22 NOTE — Progress Notes (Signed)
ANTIBIOTIC CONSULT NOTE-Preliminary  Pharmacy Consult for Vancomycin and Zosyn Indication: Sepsis  No Known Allergies  Patient Measurements: Height: 6\' 3"  (190.5 cm) Weight: (!) 345 lb (156.491 kg) IBW/kg (Calculated) : 84.5  Vital Signs: Temp: 100.1 F (37.8 C) (10/22 0305) Temp Source: Rectal (10/22 0305) BP: 135/83 mmHg (10/22 0345) Pulse Rate: 107 (10/22 0345)  Labs:  Recent Labs  May 10, 2014 0215  WBC 8.5  HGB 13.5  PLT 132*  CREATININE 0.70    Estimated Creatinine Clearance: 151.5 mL/min (by C-G formula based on Cr of 0.7).  No results for input(s): VANCOTROUGH, VANCOPEAK, VANCORANDOM, GENTTROUGH, GENTPEAK, GENTRANDOM, TOBRATROUGH, TOBRAPEAK, TOBRARND, AMIKACINPEAK, AMIKACINTROU, AMIKACIN in the last 72 hours.   Microbiology: Recent Results (from the past 720 hour(s))  Blood Culture (routine x 2)     Status: None (Preliminary result)   Collection Time: May 10, 2014  2:15 AM  Result Value Ref Range Status   Specimen Description RIGHT ANTECUBITAL  Final   Special Requests BOTTLES DRAWN AEROBIC ONLY 6CC DRAWN BY RN  Final   Culture PENDING  Incomplete   Report Status PENDING  Incomplete  Blood Culture (routine x 2)     Status: None (Preliminary result)   Collection Time: May 10, 2014  2:41 AM  Result Value Ref Range Status   Specimen Description BLOOD RIGHT HAND  Final   Special Requests BOTTLES DRAWN AEROBIC AND ANAEROBIC [facility]7CC  Final   Culture PENDING  Incomplete   Report Status PENDING  Incomplete    Medical History: Past Medical History  Diagnosis Date  . Hypertension   . Hyperlipidemia   . ED (erectile dysfunction)   . Obesity   . Sensory neuropathy ([facility])   . Microproteinuria   . Venous stasis   . Diabetes mellitus     fasting 90-120  . Arthritis     Medications:  Vancomycin 1 Gm IV in the ED at 0215 Zosyn 3.375 Gm IV at 0215  Assessment: 64 yo male with symptoms of fever, cough, SOB. WBCs normal; elevated D-dimer. Ct shows evidence of multifocal  pneumonia.  R/o PE. Blood cultures pending. Pt meets SIRS criteria for broad-spectrum antibiotics. Zosyn and Vancomycin given in the ED.  Goal of Therapy:  Vancomycin troughs 15-20 mcg/ml Eradicate infection  Plan:  Preliminary review of pertinent patient information completed.  Protocol will be initiated with a one-time dose of Vancomycin 1500 mg IV in addition to the 1 Gm dose previously ordered. Total dose= 2.5 gm.  Brandon HawkingAnnie Penn clinical pharmacist will complete review during morning rounds to assess patient and finalize treatment regimen.  Arelia SneddonMason, Brandon Villarreal Anne, RPH 07-22-2014,4:41 AM

## 2014-11-22 NOTE — H&P (Signed)
PULMONARY / CRITICAL CARE MEDICINE   Name: Brandon Villarreal MRN: [id number] DOB: 02/25/1950    ADMISSION DATE:  11/19/2014 CONSULTATION DATE:  11/25/2014  REFERRING MD :  Dr. Jerilee Hoh / [facility] @ [facility]  CHIEF COMPLAINT:  Acute Respiratory Failure   INITIAL PRESENTATION: 64 y/o M with PMH of obesity, HTN, HLD, DM who presented to [facility] on 10/22 with complaints of shortness of breath.  Pt felt it started from exposure to acrylic spray pain in a closed area.  Work up concerning for multi-focal PNA and LE swelling which raised concern for CHF.  Intubated in ER per anesthesia.  Pt transferred to [facility] for further care.    STUDIES:  10/22 CTA Chest >> neg for PE, diffuse bilateral central airspace opacification R>L with air bronchograms, diffuse coronary calcifications, healing minimally displaced fracture of R lateral 6th rib 10/22 ECHO >> EF 45-50%, mild diffuse hypokinesis  SIGNIFICANT EVENTS: 10/22  Admit with diffuse bilateral airspace disease.  CT negative for PE   HISTORY OF PRESENT ILLNESS:  The patient is sedated on mechanical ventilation.  Information obtained from prior medical documentation.    64 y/o M, never smoker,  with PMH of obesity, HTN, HLD, ED, venous stasis, arthritis, and DM who presented to [facility] on 10/22 with complaints of shortness of breath.    Notes reflect the patient reported he had been short of breath for four days, subjective fever, and dry cough.  Shortness of breath became acutely worse after his wife was using acrylic pain for Christmas ornaments.  In the ER, he was noted to be hypoxic with saturations in the low 80's.  He was treated with BiPAP.  D-Dimer was elevated at 6.12.  Given hypoxia and SOB a CTA of the chest was assessed to rule out PE which was negative but showed diffuse bilateral central airspace opacification R>L with air bronchograms, diffuse coronary calcifications, healing minimally displaced fracture of R lateral 6th rib.  He also was noted to have increased  LE edema (takes lasix at home).  Labs - Na 131, K 3.1, sr cr 0.70, glucose 236, AST 72, ALT 70, lactic acid 2.18, troponin 0.03, BNP 61, WBC 8.5, hbg 13.5, and platelets 132. UA showed 3-6 wbc, few bacteria, trace ketones, glucose 500. Flu panel was negative.  He developed fever to 101.4 and failed bipap therapy.  Pa/FiO2 ratio on 90% O2 was 141.  The patient was intubated per anesthesia and transferred to [facility] for further care.     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   Prior to Admission medications   Medication Sig Start Date End Date Taking? Authorizing Provider  amLODipine (NORVASC) 10 MG tablet Take 0.5 tablets (5 mg total) by mouth 2 (two) times daily. 06/11/14  Yes Mikey Kirschner, MD  aspirin 81 MG tablet Take 81 mg by mouth daily.   Yes Historical Provider, MD  benazepril (LOTENSIN) 40 MG tablet Take 1 tablet (40 mg total) by mouth daily. 06/11/14  Yes Mikey Kirschner, MD  doxazosin (CARDURA) 2 MG tablet Take 1 tablet (2 mg total) by mouth daily. 06/11/14  Yes Mikey Kirschner, MD  fenofibrate 160 MG tablet Take 1 tablet (160 mg total) by mouth  daily. 06/11/14  Yes Mikey Kirschner, MD  furosemide (LASIX) 40 MG tablet Take 1 tablet (40 mg total) by mouth daily. 06/11/14  Yes Mikey Kirschner, MD  glyBURIDE-metformin (GLUCOVANCE) 5-500 MG per tablet Take 2 tablets by mouth 2 (two) times daily with a meal. 06/11/14  Yes Mikey Kirschner, MD  ibuprofen (ADVIL,MOTRIN) 200 MG tablet Take 400 mg by mouth every 6 (six) hours as needed for moderate pain.   Yes Historical Provider, MD  insulin glargine (LANTUS) 100 UNIT/ML injection  Inject 0.6 mLs (60 Units total) into the skin 2 (two) times daily. 06/11/14  Yes Mikey Kirschner, MD  potassium chloride SA (K-DUR,KLOR-CON) 20 MEQ tablet TAKE ONE TABLET BY MOUTH EVERY DAY 06/11/14  Yes Mikey Kirschner, MD  pravastatin (PRAVACHOL) 20 MG tablet Take 1 tablet (20 mg total) by mouth every evening. 06/11/14 06/11/15 Yes Mikey Kirschner, MD  blood glucose meter kit and supplies KIT Test blood sugar once daily as directed for diabetes type 2. Code E11.9. 02/26/14   Mikey Kirschner, MD   No Known Allergies  FAMILY HISTORY:  has no family status information on file.    SOCIAL HISTORY:  reports that he has never smoked. He does not have any smokeless tobacco history on file. He reports that he does not drink alcohol or use illicit drugs.  REVIEW OF SYSTEMS:  Unable to complete with patient as he is altered on mechanical ventilation   SUBJECTIVE:   VITAL SIGNS: Temp:  [99 F (37.2 C)-103.8 F (39.9 C)] 103.8 F (39.9 C) (10/22 1629) Pulse Rate:  [95-135] 120 (10/22 1600) Resp:  [19-42] 35 (10/22 1600) BP: (81-172)/(46-97) 133/61 mmHg (10/22 1600) SpO2:  [78 %-100 %] 88 % (10/22 1600) FiO2 (%):  [100 %] 100 % (10/22 1600) Weight:  [345 lb (156.491 kg)-384 lb (174.181 kg)] 384 lb (174.181 kg) (10/22 1600)   HEMODYNAMICS:   VENTILATOR SETTINGS: Vent Mode:  [-] PRVC FiO2 (%):  [100 %] 100 % Set Rate:  [20 bmp] 20 bmp Vt Set:  [573 mL] 680 mL PEEP:  [20 cmH20-24 cmH20] 20 cmH20 Plateau Pressure:  [36 cmH20] 36 cmH20 INTAKE / OUTPUT:  Intake/Output Summary (Last 24 hours) at 11/19/2014 1822 Last data filed at 11/09/2014 1600  Gross per 24 hour  Intake 913.81 ml  Output    575 ml  Net 338.81 ml    PHYSICAL EXAMINATION: General:  Morbidly obese male on vent Neuro:  Sedated, pupils 2-53m HEENT:  OETT, MM pink/moist, short / thick neck Cardiovascular:  s1s2 rrr, no m/r/g Lungs:  Dyssynchrony on vent, lungs bilaterally diminished, coarse  Abdomen:  Obese/soft, bsx4  active  Musculoskeletal:  No acute deformities  Skin: hot to touch, dry, 3+ pitting edema in BLE's, R great toe with small annular open area without drainage   LABS:  CBC  Recent Labs Lab 11/11/2014 0215  WBC 8.5  HGB 13.5  HCT 37.9*  PLT 132*   Coag's No results for input(s): APTT, INR in the last 168 hours. BMET  Recent Labs Lab 11/24/2014 0215  NA 131*  K 3.1*  CL 97*  CO2 23  BUN 17  CREATININE 0.70  GLUCOSE 236*   Electrolytes  Recent Labs Lab 11/01/2014 0215  CALCIUM 9.0   Sepsis Markers  Recent Labs Lab 12/01/2014 0226 11/03/2014 0518  LATICACIDVEN 2.16* 2.18*   ABG  Recent Labs Lab 11/30/2014 0225 11/13/2014 0845 11/21/2014 1215  PHART 7.421 7.383 7.325*  PCO2ART 36.1 37.9 40.8  PO2ART 89.7 80.00 127.0*   Liver Enzymes  Recent Labs Lab 11/21/2014 0215  AST 72*  ALT 70*  ALKPHOS 123  BILITOT 2.3*  ALBUMIN 2.9*   Cardiac Enzymes No results for input(s): TROPONINI, PROBNP in the last 168 hours. Glucose  Recent Labs Lab 11/21/2014 0740 11/13/2014 1115 11/30/2014 1626  GLUCAP 206* 182* 181*    Imaging Ct Angio Chest Pe W/cm &/or Wo Cm  11/15/2014  CLINICAL DATA:  Acute onset of fever, dry cough and shortness of breath. Initial encounter. EXAM: CT ANGIOGRAPHY CHEST WITH CONTRAST TECHNIQUE: Multidetector CT imaging of the chest was performed using the standard protocol during bolus administration of intravenous contrast. Multiplanar CT image reconstructions and MIPs were obtained to evaluate the vascular anatomy. CONTRAST:  100 mL of Omnipaque 350 IV contrast COMPARISON:  Chest radiograph performed earlier today at 2:32 a.m. FINDINGS: There is no evidence of central pulmonary embolus. Evaluation for pulmonary embolus is markedly suboptimal due to limitations in the timing of the contrast bolus and extensive airspace consolidation. Diffuse bilateral central airspace opacification is noted, worse on the right, with associated air bronchograms. This is  suspicious for diffuse bilateral pneumonia. A small right pleural effusion is noted. Given the extent of disease, ARDS is a concern. There is no evidence of pneumothorax. No obvious masses are identified; no abnormal focal contrast enhancement is seen. Diffuse coronary artery calcifications are seen. Enlarged mediastinal nodes are seen, measuring up to 1.6 cm, at the right paratracheal, periaortic and azygoesophageal recess regions. No pericardial effusion is identified. The mediastinum is difficult to fully assess due to the patient's habitus, but appears otherwise unremarkable. No axillary lymphadenopathy is seen. The visualized portions of the thyroid gland are unremarkable in appearance. The visualized portions of the liver and spleen are unremarkable. No acute osseous abnormalities are seen. There is a healing minimally displaced fracture through the right lateral sixth rib. Review of the MIP images confirms the above findings. IMPRESSION: 1. No evidence of central pulmonary embolus. 2. Diffuse bilateral central airspace opacification, worse on the right, with associated air bronchograms. Given the patient's symptoms, this is suspicious for diffuse bilateral pneumonia. Small right pleural effusion noted. Given the extent of disease, ARDS is a concern. 3. Diffuse coronary artery calcifications seen. 4. Enlarged mediastinal nodes likely reflect the acute infectious process. 5. Healing minimally displaced fracture through the right lateral sixth rib. Electronically Signed   By: Garald Balding M.D.   On: 11/13/2014 05:03   Dg Chest Port 1 View  11/16/2014  CLINICAL DATA:  ETT placement, respiratory distress EXAM: PORTABLE CHEST 1 VIEW COMPARISON:  CT chest dated 11/28/2014 FINDINGS: Endotracheal tube terminates 4.5 cm above the carina. Low lung volumes. Multifocal patchy opacities in the lungs bilaterally, suggesting multifocal pneumonia. No pleural effusion or pneumothorax. Enteric tube courses into the  stomach. IMPRESSION: Endotracheal tube terminates 4.5 cm above the carina. Low lung volumes with multifocal bilateral patchy opacities, suggesting multifocal pneumonia, better evaluated on recent CT. Electronically Signed   By: Julian Hy M.D.   On: 11/25/2014 11:21   Dg Chest Port 1 View  11/01/2014  CLINICAL DATA:  Code sepsis. Fever, shortness breath and cough, acute onset. Initial encounter. EXAM: PORTABLE CHEST 1 VIEW COMPARISON:  Chest radiograph performed 07/19/2013 FINDINGS: The lungs are well-aerated. Diffuse bilateral airspace opacities raise concern for multifocal pneumonia, given the patient's symptoms. Small bilateral pleural effusions are suspected. No pneumothorax is seen. The cardiomediastinal silhouette is enlarged. No acute osseous abnormalities are identified. IMPRESSION: Diffuse bilateral  airspace opacities raise concern for multifocal pneumonia, given the patient's symptoms. Small bilateral pleural effusions suspected. Cardiomegaly. Electronically Signed   By: Garald Balding M.D.   On: 11/03/2014 02:44     ASSESSMENT / PLAN:  PULMONARY OETT 10/22 >>  A: Acute Hypoxic Respiratory Failure - secondary to diffuse R>L bilateral infiltrates  Multi-focal PNA / CAP -  ARDS - moderate based on Pa/FiO2 ratio 141 on admit  P:   MV support, 8cc/kg, rate 25 ARDS protocol  Wean PEEP/FiO2 for sats > 92% CXR on arrival  Assess ABG one hour after arrival  Paralytics for vent synchrony, initiated 10/22  See ID  Consider FOB for sputum sampling, eosinophils  Droplet precautions   CARDIOVASCULAR CVL L FEM 10/22 >>  A:  Soft/Normal BP - suspect sedation related  R/O CHF  Hx HTN/HLD Venous Stasis  P:  Daily ASA Hold benazepril with AKI risk  Hold norvasc, cardura, fenofibrate May need levophed if MAP <65  RENAL A:   Hypokalemia - on lasix at home  P:   Trend BMP / UOP  Replace electrolytes as indicated  NS at 40m/hr Hold lasix   GASTROINTESTINAL A:   Vent  Associated Dysphagia - intubated for respiratory failure  P:   NPO Place OGT Begin TF, oxepa with ARDS  HEMATOLOGIC A:   No acute process  P:  Heparin for DVT prophylaxis  Transfuse per ICU guidelines  INFECTIOUS A:   Fever R/O Flu Sepsis  Multi-Focal Airspace Disease - concerning for CAP  P:   BCx2 10/22 >>  UC 10/22 >>  Sputum 10/22 >>  Influenza 10/22 >> neg  Influenza tracheal aspirate 10/22 >>   Vanco, start date 10/22, day 1/x Zosyn, start date 10/22, day 1/x   ENDOCRINE A:   DM   P:   SSI  Lantus 60 units BID (reduced from home dose of 100 BID) Hold home glyburide-metformin    NEUROLOGIC A:   Sedation needs  P:   RASS goal: -2 Neuromuscular blockade  ARDS protocol  Fentanyl gtt / Versed gtts D/C propofol    FAMILY  - Updates: Family updated at bedside.  Full code.    - Inter-disciplinary family meet or Palliative Care meeting due by: 10/30    BNoe Gens NP-C [facility] Pgr: [phone] or if no answer [phone]/22/2016, 6:22 PM

## 2014-11-23 ENCOUNTER — Inpatient Hospital Stay (HOSPITAL_COMMUNITY): Payer: BLUE CROSS/BLUE SHIELD

## 2014-11-23 DIAGNOSIS — J9601 Acute respiratory failure with hypoxia: Secondary | ICD-10-CM | POA: Insufficient documentation

## 2014-11-23 DIAGNOSIS — J80 Acute respiratory distress syndrome: Secondary | ICD-10-CM

## 2014-11-23 LAB — BLOOD GAS, ARTERIAL
ACID-BASE DEFICIT: 4.6 mmol/L — AB (ref 0.0–2.0)
Acid-base deficit: 4.7 mmol/L — ABNORMAL HIGH (ref 0.0–2.0)
Acid-base deficit: 5.2 mmol/L — ABNORMAL HIGH (ref 0.0–2.0)
Acid-base deficit: 5.5 mmol/L — ABNORMAL HIGH (ref 0.0–2.0)
BICARBONATE: 19.9 meq/L — AB (ref 20.0–24.0)
BICARBONATE: 20.8 meq/L (ref 20.0–24.0)
BICARBONATE: 21.4 meq/L (ref 20.0–24.0)
Bicarbonate: 21.2 mEq/L (ref 20.0–24.0)
DRAWN BY: 437071
DRAWN BY: 437071
Drawn by: 437071
FIO2: 1
FIO2: 1
FIO2: 1
FIO2: 100
LHR: 35 {breaths}/min
LHR: 35 {breaths}/min
MECHVT: 510 mL
MECHVT: 510 mL
O2 SAT: 88.7 %
O2 SAT: 98.5 %
O2 Saturation: 89.5 %
O2 Saturation: 95 %
PATIENT TEMPERATURE: 101
PATIENT TEMPERATURE: 99.1
PATIENT TEMPERATURE: 99.8
PCO2 ART: 43.8 mmHg (ref 35.0–45.0)
PCO2 ART: 51 mmHg — AB (ref 35.0–45.0)
PEEP/CPAP: 20 cmH2O
PEEP/CPAP: 22 cmH2O
PEEP: 20 cmH2O
PEEP: 22 cmH2O
PH ART: 7.251 — AB (ref 7.350–7.450)
PH ART: 7.284 — AB (ref 7.350–7.450)
PO2 ART: 134 mmHg — AB (ref 80.0–100.0)
Patient temperature: 101.7
RATE: 32 resp/min
RATE: 35 resp/min
TCO2: 21.2 mmol/L (ref 0–100)
TCO2: 22.2 mmol/L (ref 0–100)
TCO2: 22.7 mmol/L (ref 0–100)
TCO2: 23 mmol/L (ref 0–100)
VT: 510 mL
VT: 510 mL
pCO2 arterial: 49 mmHg — ABNORMAL HIGH (ref 35.0–45.0)
pCO2 arterial: 55.3 mmHg — ABNORMAL HIGH (ref 35.0–45.0)
pH, Arterial: 7.252 — ABNORMAL LOW (ref 7.350–7.450)
pH, Arterial: 7.224 — ABNORMAL LOW (ref 7.350–7.450)
pO2, Arterial: 59.6 mmHg — ABNORMAL LOW (ref 80.0–100.0)
pO2, Arterial: 61.2 mmHg — ABNORMAL LOW (ref 80.0–100.0)
pO2, Arterial: 87.4 mmHg (ref 80.0–100.0)

## 2014-11-23 LAB — CBC
HCT: 35.9 % — ABNORMAL LOW (ref 39.0–52.0)
Hemoglobin: 11.9 g/dL — ABNORMAL LOW (ref 13.0–17.0)
MCH: 27.4 pg (ref 26.0–34.0)
MCHC: 33.1 g/dL (ref 30.0–36.0)
MCV: 82.7 fL (ref 78.0–100.0)
PLATELETS: 127 10*3/uL — AB (ref 150–400)
RBC: 4.34 MIL/uL (ref 4.22–5.81)
RDW: 14.8 % (ref 11.5–15.5)
WBC: 7 10*3/uL (ref 4.0–10.5)

## 2014-11-23 LAB — GLUCOSE, CAPILLARY
GLUCOSE-CAPILLARY: 141 mg/dL — AB (ref 65–99)
Glucose-Capillary: 196 mg/dL — ABNORMAL HIGH (ref 65–99)
Glucose-Capillary: 178 mg/dL — ABNORMAL HIGH (ref 65–99)
Glucose-Capillary: 199 mg/dL — ABNORMAL HIGH (ref 65–99)

## 2014-11-23 LAB — COMPREHENSIVE METABOLIC PANEL
ALT: 53 U/L (ref 17–63)
ANION GAP: 8 (ref 5–15)
AST: 44 U/L — AB (ref 15–41)
Albumin: 1.9 g/dL — ABNORMAL LOW (ref 3.5–5.0)
Alkaline Phosphatase: 88 U/L (ref 38–126)
BUN: 22 mg/dL — ABNORMAL HIGH (ref 6–20)
CHLORIDE: 101 mmol/L (ref 101–111)
CO2: 22 mmol/L (ref 22–32)
Calcium: 7.5 mg/dL — ABNORMAL LOW (ref 8.9–10.3)
Creatinine, Ser: 1.09 mg/dL (ref 0.61–1.24)
Glucose, Bld: 186 mg/dL — ABNORMAL HIGH (ref 65–99)
POTASSIUM: 3.7 mmol/L (ref 3.5–5.1)
Sodium: 131 mmol/L — ABNORMAL LOW (ref 135–145)
Total Bilirubin: 2.2 mg/dL — ABNORMAL HIGH (ref 0.3–1.2)
Total Protein: 4.7 g/dL — ABNORMAL LOW (ref 6.5–8.1)

## 2014-11-23 LAB — POCT I-STAT 3, ART BLOOD GAS (G3+)
ACID-BASE DEFICIT: 6 mmol/L — AB (ref 0.0–2.0)
BICARBONATE: 21.9 meq/L (ref 20.0–24.0)
O2 Saturation: 78 %
PO2 ART: 55 mmHg — AB (ref 80.0–100.0)
TCO2: 24 mmol/L (ref 0–100)
pCO2 arterial: 56.9 mmHg — ABNORMAL HIGH (ref 35.0–45.0)
pH, Arterial: 7.198 — CL (ref 7.350–7.450)

## 2014-11-23 LAB — TROPONIN I
Troponin I: 0.1 ng/mL — ABNORMAL HIGH (ref <0.031)
Troponin I: 0.05 ng/mL — ABNORMAL HIGH (ref <0.031)
Troponin I: 0.03 ng/mL (ref <0.031)

## 2014-11-23 LAB — PHOSPHORUS: PHOSPHORUS: 3.4 mg/dL (ref 2.5–4.6)

## 2014-11-23 LAB — MAGNESIUM: MAGNESIUM: 1.4 mg/dL — AB (ref 1.7–2.4)

## 2014-11-23 LAB — LACTIC ACID, PLASMA: Lactic Acid, Venous: 0.9 mmol/L (ref 0.5–2.0)

## 2014-11-23 LAB — PROCALCITONIN: PROCALCITONIN: 1.82 ng/mL

## 2014-11-23 MED ORDER — FUROSEMIDE 10 MG/ML IJ SOLN
40.0000 mg | Freq: Three times a day (TID) | INTRAMUSCULAR | Status: DC
  Administered 2014-11-23 – 2014-11-24 (×3): 40 mg via INTRAVENOUS
  Filled 2014-11-23 (×6): qty 4

## 2014-11-23 MED ORDER — ASPIRIN 81 MG PO CHEW
81.0000 mg | CHEWABLE_TABLET | Freq: Every day | ORAL | Status: DC
  Administered 2014-11-23 – 2014-11-29 (×7): 81 mg via ORAL
  Filled 2014-11-23 (×7): qty 1

## 2014-11-23 MED ORDER — SODIUM BICARBONATE 8.4 % IV SOLN
INTRAVENOUS | Status: DC
  Administered 2014-11-23 – 2014-11-24 (×3): via INTRAVENOUS
  Filled 2014-11-23 (×5): qty 150

## 2014-11-23 MED ORDER — POTASSIUM CHLORIDE 20 MEQ/15ML (10%) PO SOLN
40.0000 meq | Freq: Every day | ORAL | Status: DC
  Administered 2014-11-23: 40 meq via ORAL
  Filled 2014-11-23 (×2): qty 30

## 2014-11-23 MED ORDER — INFLUENZA VAC SPLIT QUAD 0.5 ML IM SUSY
0.5000 mL | PREFILLED_SYRINGE | INTRAMUSCULAR | Status: DC
  Filled 2014-11-23: qty 0.5

## 2014-11-23 MED ORDER — PNEUMOCOCCAL VAC POLYVALENT 25 MCG/0.5ML IJ INJ
0.5000 mL | INJECTION | INTRAMUSCULAR | Status: DC
  Filled 2014-11-23: qty 0.5

## 2014-11-23 MED ORDER — POTASSIUM CHLORIDE 20 MEQ/15ML (10%) PO SOLN
40.0000 meq | Freq: Two times a day (BID) | ORAL | Status: DC
  Administered 2014-11-23 – 2014-11-24 (×3): 40 meq
  Filled 2014-11-23 (×6): qty 30

## 2014-11-23 MED ORDER — MAGNESIUM SULFATE 2 GM/50ML IV SOLN
2.0000 g | Freq: Once | INTRAVENOUS | Status: AC
  Administered 2014-11-23: 2 g via INTRAVENOUS
  Filled 2014-11-23: qty 50

## 2014-11-23 NOTE — Progress Notes (Signed)
Pt ett found at 23 cm at the lip. Advanced to 25 cm at the lip per verbal order Dr Marchelle Gearingamaswamy. CXR pending post change

## 2014-11-23 NOTE — Progress Notes (Signed)
Approx 1300 the patient was proned. OG is post pyloric and confirmed

## 2014-11-23 NOTE — Progress Notes (Signed)
Critical ABG results received, but improved from prior gas, trending upward. Dr Marchelle Gearingamaswamy notified PH: 7.22 PaCO2: 55.3 PaO2: 87.4 HCO3: 21.4

## 2014-11-23 NOTE — H&P (Signed)
PULMONARY / CRITICAL CARE MEDICINE   Name: Brandon Villarreal MRN: [id number] DOB: [birth year]    ADMISSION DATE:  11/10/2014 CONSULTATION DATE:  November 29, 2014  REFERRING MD :  Dr. Ardyth Harps / [facility] @ [facility]  CHIEF COMPLAINT:  Acute Respiratory Failure   INITIAL PRESENTATION:     64 y/o M, never smoker,  with PMH of obesity, HTN, HLD, ED, venous stasis, arthritis, and DM who presented to [facility] on 10/22 with complaints of shortness of breath.    Notes reflect the patient reported he had been short of breath for four days, subjective fever, and dry cough.  Shortness of breath became acutely worse after his wife was using acrylic pain for Christmas ornaments.  In the ER, he was noted to be hypoxic with saturations in the low 80's.  He was treated with BiPAP.  D-Dimer was elevated at 6.12.  Given hypoxia and SOB a CTA of the chest was assessed to rule out PE which was negative but showed diffuse bilateral central airspace opacification R>L with air bronchograms, diffuse coronary calcifications, healing minimally displaced fracture of R lateral 6th rib.  He also was noted to have increased LE edema (takes lasix at home).  Labs - Na 131, K 3.1, sr cr 0.70, glucose 236, AST 72, ALT 70, lactic acid 2.18, troponin 0.03, BNP 61, WBC 8.5, hbg 13.5, and platelets 132. UA showed 3-6 wbc, few bacteria, trace ketones, glucose 500. Flu panel was negative.  He developed fever to 101.4 and failed bipap therapy.  Pa/FiO2 ratio on 90% O2 was 141.  The patient was intubated per anesthesia and transferred to [facility] for further care.     STUDIES and events 10/22 CTA Chest >> neg for PE, diffuse bilateral central airspace opacification R>L with air bronchograms, diffuse coronary calcifications, healing minimally displaced fracture of R lateral 6th rib 10/22 ECHO >> EF 45-50%, mild diffuse hypokinesis 10/22  Admit with diffuse bilateral airspace disease.  CT negative for PE. ARDS PROTOCOL with NIMBEX - severe ARDS  SUBJECTIVE:    11/23/2014: Severe ARDS persists. He is on 100% oxygen and PEEP 22. paralyzed and sedated. BIS monitor score is 42. Synchronous with the ventilator. Normal renal function and normal blood pressure. Echo with ejection fraction 45%.  VITAL SIGNS: Temp:  [99 F (37.2 C)-105.1 F (40.6 C)] 99.5 F (37.5 C) (10/23 0827) Pulse Rate:  [54-135] 103 (10/23 0900) Resp:  [0-42] 35 (10/23 0900) BP: (81-161)/(29-95) 117/60 mmHg (10/23 0900) SpO2:  [78 %-99 %] 93 % (10/23 0900) Arterial Line BP: (135-183)/(42-50) 135/45 mmHg (10/23 0900) FiO2 (%):  [100 %] 100 % (10/23 0317) Weight:  [168 kg (370 lb 6 oz)-174.181 kg (384 lb)] 170.552 kg (376 lb) (10/23 0500)   HEMODYNAMICS:   VENTILATOR SETTINGS: Vent Mode:  [-] PRVC FiO2 (%):  [100 %] 100 % Set Rate:  [20 bmp-35 bmp] 35 bmp Vt Set:  [510 mL-680 mL] 510 mL PEEP:  [20 cmH20-24 cmH20] 22 cmH20 Plateau Pressure:  [34 cmH20-37 cmH20] 37 cmH20 INTAKE / OUTPUT:  Intake/Output Summary (Last 24 hours) at 11/23/14 1000 Last data filed at 11/23/14 0800  Gross per 24 hour  Intake 3180.66 ml  Output    330 ml  Net 2850.66 ml    PHYSICAL EXAMINATION: General:  obese male on vent Neuro:  BIS 42 on sedation and nimbex HEENT:  OETT, MM pink/moist, short / thick neck Cardiovascular:  s1s2 rrr, no m/r/g Lungs:  Ssynchrony on vent, lungs bilaterally diminished, coarse  Abdomen:  Obese/soft,  bsx4 active  Musculoskeletal:  No acute deformities  Skin: hot to touch, dry, 3+ pitting edema in BLE's, R great toe with small annular open area without drainage   LABS:  PULMONARY  Recent Labs Lab 12-04-14 1847 04-Dec-2014 2140 12-04-14 2310 11/23/14 0025 11/23/14 0350  PHART 7.280* 7.232* 7.305* 7.284* 7.252*  PCO2ART 48.6* 56.4* 45.3* 43.8 49.0*  PO2ART 80.0 67.9* 64.2* 59.6* 61.2*  HCO3 22.0 21.9 21.3 19.9* 20.8  TCO2 23 23.4 22.6 21.2 22.2  O2SAT 90.0 87.6 89.6 88.7 89.5    CBC  Recent Labs Lab 12-04-14 0215 11/23/14 0532  HGB 13.5 11.9*   HCT 37.9* 35.9*  WBC 8.5 7.0  PLT 132* 127*    COAGULATION No results for input(s): INR in the last 168 hours.  CARDIAC   Recent Labs Lab December 04, 2014 2143 11/23/14 0532  TROPONINI 0.10* 0.05*   No results for input(s): PROBNP in the last 168 hours.   CHEMISTRY  Recent Labs Lab 12-04-2014 0215 11/23/14 0532  NA 131* 131*  K 3.1* 3.7  CL 97* 101  CO2 23 22  GLUCOSE 236* 186*  BUN 17 22*  CREATININE 0.70 1.09  CALCIUM 9.0 7.5*  MG  --  1.4*  PHOS  --  3.4   Estimated Creatinine Clearance: 116.7 mL/min (by C-G formula based on Cr of 1.09).   LIVER  Recent Labs Lab 04-Dec-2014 0215 11/23/14 0532  AST 72* 44*  ALT 70* 53  ALKPHOS 123 88  BILITOT 2.3* 2.2*  PROT 6.3* 4.7*  ALBUMIN 2.9* 1.9*     INFECTIOUS  Recent Labs Lab 12-04-14 0226 2014-12-04 0518 12/04/2014 2143 12-04-14 2151 11/23/14 0532  LATICACIDVEN 2.16* 2.18*  --  0.9  --   PROCALCITON  --   --  1.80  --  1.82     ENDOCRINE CBG (last 3)   Recent Labs  2014/12/04 1944 04-Dec-2014 2345 11/23/14 0828  GLUCAP 145* 141* 196*         IMAGING x48h  - image(s) personally visualized  -   highlighted in bold Ct Angio Chest Pe W/cm &/or Wo Cm  12-04-14  CLINICAL DATA:  Acute onset of fever, dry cough and shortness of breath. Initial encounter. EXAM: CT ANGIOGRAPHY CHEST WITH CONTRAST TECHNIQUE: Multidetector CT imaging of the chest was performed using the standard protocol during bolus administration of intravenous contrast. Multiplanar CT image reconstructions and MIPs were obtained to evaluate the vascular anatomy. CONTRAST:  100 mL of Omnipaque 350 IV contrast COMPARISON:  Chest radiograph performed earlier today at 2:32 a.m. FINDINGS: There is no evidence of central pulmonary embolus. Evaluation for pulmonary embolus is markedly suboptimal due to limitations in the timing of the contrast bolus and extensive airspace consolidation. Diffuse bilateral central airspace opacification is noted, worse  on the right, with associated air bronchograms. This is suspicious for diffuse bilateral pneumonia. A small right pleural effusion is noted. Given the extent of disease, ARDS is a concern. There is no evidence of pneumothorax. No obvious masses are identified; no abnormal focal contrast enhancement is seen. Diffuse coronary artery calcifications are seen. Enlarged mediastinal nodes are seen, measuring up to 1.6 cm, at the right paratracheal, periaortic and azygoesophageal recess regions. No pericardial effusion is identified. The mediastinum is difficult to fully assess due to the patient's habitus, but appears otherwise unremarkable. No axillary lymphadenopathy is seen. The visualized portions of the thyroid gland are unremarkable in appearance. The visualized portions of the liver and spleen are unremarkable. No acute osseous abnormalities  are seen. There is a healing minimally displaced fracture through the right lateral sixth rib. Review of the MIP images confirms the above findings. IMPRESSION: 1. No evidence of central pulmonary embolus. 2. Diffuse bilateral central airspace opacification, worse on the right, with associated air bronchograms. Given the patient's symptoms, this is suspicious for diffuse bilateral pneumonia. Small right pleural effusion noted. Given the extent of disease, ARDS is a concern. 3. Diffuse coronary artery calcifications seen. 4. Enlarged mediastinal nodes likely reflect the acute infectious process. 5. Healing minimally displaced fracture through the right lateral sixth rib. Electronically Signed   By: Roanna RaiderJeffery  Chang M.D.   On: 2014-04-24 05:03   Dg Chest Port 1 View  11/23/2014  CLINICAL DATA:  Endotracheal intubation, hypertension, diabetes mellitus, acute respiratory failure EXAM: PORTABLE CHEST 1 VIEW COMPARISON:  Portable exam 0523 hours compared to 2014-04-24 FINDINGS: Tip of endotracheal tube projects 4.8 cm above carina. Nasogastric tube extends into stomach. Enlargement  of cardiac silhouette. Atherosclerotic calcification aorta. Extensive BILATERAL airspace infiltrates unchanged. No pleural effusion or pneumothorax. IMPRESSION: Persistent severe diffuse BILATERAL airspace infiltrates. Electronically Signed   By: Ulyses SouthwardMark  Boles M.D.   On: 11/23/2014 08:14   Dg Chest Port 1 View  2014-04-24  CLINICAL DATA:  Central line placement. EXAM: PORTABLE CHEST 1 VIEW COMPARISON:  Same day. FINDINGS: Endotracheal and nasogastric tubes are in grossly good position. No pneumothorax is noted. Stable bilateral airspace opacities are noted consistent with pneumonia or possibly edema. No significant pleural effusion is noted. No new central line is noted. Bony thorax is unremarkable. IMPRESSION: Stable bilateral airspace opacities consistent with pneumonia or edema. Stable support apparatus. No pneumothorax is noted. Electronically Signed   By: Lupita RaiderJames  Green Jr, M.D.   On: 2014-04-24 18:52   Dg Chest Port 1 View  2014-04-24  CLINICAL DATA:  ETT placement, respiratory distress EXAM: PORTABLE CHEST 1 VIEW COMPARISON:  CT chest dated 2014-04-24 FINDINGS: Endotracheal tube terminates 4.5 cm above the carina. Low lung volumes. Multifocal patchy opacities in the lungs bilaterally, suggesting multifocal pneumonia. No pleural effusion or pneumothorax. Enteric tube courses into the stomach. IMPRESSION: Endotracheal tube terminates 4.5 cm above the carina. Low lung volumes with multifocal bilateral patchy opacities, suggesting multifocal pneumonia, better evaluated on recent CT. Electronically Signed   By: Charline BillsSriyesh  Krishnan M.D.   On: 2014-04-24 11:21   Dg Chest Port 1 View  2014-04-24  CLINICAL DATA:  Code sepsis. Fever, shortness breath and cough, acute onset. Initial encounter. EXAM: PORTABLE CHEST 1 VIEW COMPARISON:  Chest radiograph performed 07/19/2013 FINDINGS: The lungs are well-aerated. Diffuse bilateral airspace opacities raise concern for multifocal pneumonia, given the patient's symptoms.  Small bilateral pleural effusions are suspected. No pneumothorax is seen. The cardiomediastinal silhouette is enlarged. No acute osseous abnormalities are identified. IMPRESSION: Diffuse bilateral airspace opacities raise concern for multifocal pneumonia, given the patient's symptoms. Small bilateral pleural effusions suspected. Cardiomegaly. Electronically Signed   By: Roanna RaiderJeffery  Chang M.D.   On: 2014-04-24 02:44        ASSESSMENT / PLAN:  PULMONARY OETT 10/22 >>  A: Acute Hypoxic Respiratory Failure - secondary to diffuse R>L bilateral infiltrates  Multi-focal PNA / CAP -  ARDS - severe Pa/FiO2 ratio 60   - 11/23/2014: Unchanged despite nearly 24 hours of intubation and  > 12 hours of ARDS protocol with Nimbex   P:   ARDS protocol with Nimbex since 2014-04-24 p.m. Start prone ventilation 18 hours prone +6 hours supine - specific orders written Wean PEEP/FiO2 for sats >  92% CXR with position change Assess ABG with position change  Consider FOB for sputum sampling, eosinophils when more stable given peripheral sparing Droplet precautions   CARDIOVASCULAR CVL L FEM 10/22 >>  A:  Soft/Normal BP - suspect sedation related. On Lasix at home  R/O CHF  Hx HTN/HLD Venous Stasis  - Echo with some evidence of possible acute systolic heart failure  P:  Begin daily diuresis aggressive given normal blood pressure Daily ASA Hold benazepril with AKI risk  Hold norvasc, cardura, fenofibrate May need levophed if MAP <65  RENAL A:    hypomagnesemia  P:   Replete magnesium Fluids to KVO Trend BMP / UOP  Replace electrolytes as indicated  NS at [facility]   GASTROINTESTINAL A:   Vent Associated Dysphagia - intubated for respiratory failure  P:   NPO Place OGT Begin TF - caution during position change with prone ventilation  HEMATOLOGIC A:   No acute process  P:  Heparin for DVT prophylaxis  Transfuse per ICU guidelines  INFECTIOUS A:   Fever R/O Flu Sepsis  Multi-Focal  Airspace Disease - concerning for CAP  P:   BCx2 10/22 >>  UC 10/22 >>  Sputum 10/22 >>  Influenza 10/22 >> neg  Influenza tracheal aspirate 10/22 >>  Respiratory virus panel tracheal aspirate October 22>>  Vanco, start date 10/22, day 1/x Zosyn, start date 10/22, day 1/x   ENDOCRINE A:   DM   P:   SSI  Lantus 60 units BID (reduced from home dose of 100 BID) Hold home glyburide-metformin    NEUROLOGIC A:   Sedation needs  P:   RASS goal: -4/BIS goal < 50 with fent/versed Neuromuscular blockade since Dec 21, 2014 pm ARDS protocol  Fentanyl gtt / Versed gtts   FAMILY  - Updates: Family updated at bedside at admission 12/21/14.  Full code. None at bedside 11/23/2014    - Inter-disciplinary family meet or Palliative Care meeting due by: 10/30     The patient is critically ill with multiple organ systems failure and requires high complexity decision making for assessment and support, frequent evaluation and titration of therapies, application of advanced monitoring technologies and extensive interpretation of multiple databases.   Critical Care Time devoted to patient care services described in this note is  45  Minutes. This time reflects time of care of this signee Dr Kalman Shan. This critical care time does not reflect procedure time, or teaching time or supervisory time of PA/NP/Med student/Med Resident etc but could involve care discussion time    Dr. Kalman Shan, M.D., [facility].C.P Pulmonary and Critical Care Medicine Staff Physician [facility] [place] [facility] Pager: [phone], If no answer or between  15:00h - 7:00h: call [phone]  11/23/2014 10:12 AM

## 2014-11-23 NOTE — Progress Notes (Signed)
Patient had episode of desaturation, performed two recruitment maneuvers and increased PEEP to 22 following ARDS protocol. Patient is now satting 91%. RT will continue to monitor.

## 2014-11-23 NOTE — Progress Notes (Signed)
Utilization review completed.  

## 2014-11-24 ENCOUNTER — Ambulatory Visit: Payer: BLUE CROSS/BLUE SHIELD | Admitting: Family Medicine

## 2014-11-24 ENCOUNTER — Inpatient Hospital Stay (HOSPITAL_COMMUNITY): Payer: BLUE CROSS/BLUE SHIELD

## 2014-11-24 DIAGNOSIS — J9601 Acute respiratory failure with hypoxia: Secondary | ICD-10-CM

## 2014-11-24 LAB — GLUCOSE, CAPILLARY
GLUCOSE-CAPILLARY: 153 mg/dL — AB (ref 65–99)
GLUCOSE-CAPILLARY: 215 mg/dL — AB (ref 65–99)
GLUCOSE-CAPILLARY: 221 mg/dL — AB (ref 65–99)
GLUCOSE-CAPILLARY: 225 mg/dL — AB (ref 65–99)
GLUCOSE-CAPILLARY: 244 mg/dL — AB (ref 65–99)
GLUCOSE-CAPILLARY: 309 mg/dL — AB (ref 65–99)
Glucose-Capillary: 212 mg/dL — ABNORMAL HIGH (ref 65–99)

## 2014-11-24 LAB — RESPIRATORY VIRUS PANEL
ADENOVIRUS: NEGATIVE
INFLUENZA A: NEGATIVE
INFLUENZA B 1: NEGATIVE
Metapneumovirus: NEGATIVE
Parainfluenza 1: NEGATIVE
Parainfluenza 2: NEGATIVE
Parainfluenza 3: NEGATIVE
Respiratory Syncytial Virus A: NEGATIVE
Respiratory Syncytial Virus B: NEGATIVE
Rhinovirus: NEGATIVE

## 2014-11-24 LAB — CBC
HEMATOCRIT: 34.8 % — AB (ref 39.0–52.0)
HEMOGLOBIN: 11.9 g/dL — AB (ref 13.0–17.0)
MCH: 28.4 pg (ref 26.0–34.0)
MCHC: 34.2 g/dL (ref 30.0–36.0)
MCV: 83.1 fL (ref 78.0–100.0)
Platelets: 151 10*3/uL (ref 150–400)
RBC: 4.19 MIL/uL — ABNORMAL LOW (ref 4.22–5.81)
RDW: 15.9 % — ABNORMAL HIGH (ref 11.5–15.5)
WBC: 7.4 10*3/uL (ref 4.0–10.5)

## 2014-11-24 LAB — BLOOD GAS, ARTERIAL
ACID-BASE DEFICIT: 3.1 mmol/L — AB (ref 0.0–2.0)
Bicarbonate: 22.6 mEq/L (ref 20.0–24.0)
DRAWN BY: 437071
FIO2: 1
MECHVT: 510 mL
O2 Saturation: 95.2 %
PCO2 ART: 50.1 mmHg — AB (ref 35.0–45.0)
PEEP: 22 cmH2O
Patient temperature: 98.8
RATE: 35 resp/min
TCO2: 24.2 mmol/L (ref 0–100)
pH, Arterial: 7.278 — ABNORMAL LOW (ref 7.350–7.450)
pO2, Arterial: 79.2 mmHg — ABNORMAL LOW (ref 80.0–100.0)

## 2014-11-24 LAB — HEPATIC FUNCTION PANEL
ALBUMIN: 1.8 g/dL — AB (ref 3.5–5.0)
ALK PHOS: 97 U/L (ref 38–126)
ALT: 60 U/L (ref 17–63)
AST: 85 U/L — ABNORMAL HIGH (ref 15–41)
BILIRUBIN DIRECT: 1.4 mg/dL — AB (ref 0.1–0.5)
BILIRUBIN TOTAL: 2.3 mg/dL — AB (ref 0.3–1.2)
Indirect Bilirubin: 0.9 mg/dL (ref 0.3–0.9)
Total Protein: 4.8 g/dL — ABNORMAL LOW (ref 6.5–8.1)

## 2014-11-24 LAB — MAGNESIUM: MAGNESIUM: 2.1 mg/dL (ref 1.7–2.4)

## 2014-11-24 LAB — BASIC METABOLIC PANEL
ANION GAP: 8 (ref 5–15)
Anion gap: 9 (ref 5–15)
BUN: 38 mg/dL — ABNORMAL HIGH (ref 6–20)
BUN: 39 mg/dL — ABNORMAL HIGH (ref 6–20)
CHLORIDE: 106 mmol/L (ref 101–111)
CHLORIDE: 103 mmol/L (ref 101–111)
CO2: 23 mmol/L (ref 22–32)
CO2: 25 mmol/L (ref 22–32)
CREATININE: 2.03 mg/dL — AB (ref 0.61–1.24)
CREATININE: 2.05 mg/dL — AB (ref 0.61–1.24)
Calcium: 7.4 mg/dL — ABNORMAL LOW (ref 8.9–10.3)
Calcium: 7.6 mg/dL — ABNORMAL LOW (ref 8.9–10.3)
GFR calc non Af Amer: 33 mL/min — ABNORMAL LOW (ref >60)
GFR calc non Af Amer: 33 mL/min — ABNORMAL LOW (ref >60)
GFR, EST AFRICAN AMERICAN: 38 mL/min — AB (ref >60)
GFR, EST AFRICAN AMERICAN: 38 mL/min — AB (ref >60)
GLUCOSE: 246 mg/dL — AB (ref 65–99)
Glucose, Bld: 263 mg/dL — ABNORMAL HIGH (ref 65–99)
Potassium: 5 mmol/L (ref 3.5–5.1)
Potassium: 4.6 mmol/L (ref 3.5–5.1)
Sodium: 137 mmol/L (ref 135–145)
Sodium: 137 mmol/L (ref 135–145)

## 2014-11-24 LAB — MRSA CULTURE

## 2014-11-24 LAB — HEMOGLOBIN A1C
HEMOGLOBIN A1C: 9.8 % — AB (ref 4.8–5.6)
Mean Plasma Glucose: 235 mg/dL

## 2014-11-24 LAB — URINE CULTURE: Culture: 6000

## 2014-11-24 LAB — VANCOMYCIN, TROUGH: Vancomycin Tr: 23 ug/mL — ABNORMAL HIGH (ref 10.0–20.0)

## 2014-11-24 LAB — INFLUENZA PANEL BY PCR (TYPE A & B)
H1N1 flu by pcr: NOT DETECTED
INFLAPCR: NEGATIVE
Influenza B By PCR: NEGATIVE

## 2014-11-24 LAB — LEGIONELLA PNEUMOPHILA SEROGP 1 UR AG: L. PNEUMOPHILA SEROGP 1 UR AG: POSITIVE — AB

## 2014-11-24 LAB — PROCALCITONIN: Procalcitonin: 2.98 ng/mL

## 2014-11-24 LAB — SEDIMENTATION RATE: Sed Rate: 35 mm/hr — ABNORMAL HIGH (ref 0–16)

## 2014-11-24 LAB — C-REACTIVE PROTEIN: CRP: 35.2 mg/dL — ABNORMAL HIGH (ref <1.0)

## 2014-11-24 LAB — PHOSPHORUS: PHOSPHORUS: 3.6 mg/dL (ref 2.5–4.6)

## 2014-11-24 MED ORDER — METOLAZONE 5 MG PO TABS
5.0000 mg | ORAL_TABLET | Freq: Every day | ORAL | Status: AC
  Administered 2014-11-24: 5 mg via ORAL
  Filled 2014-11-24: qty 1

## 2014-11-24 MED ORDER — SODIUM CHLORIDE 0.9 % IV BOLUS (SEPSIS)
500.0000 mL | Freq: Once | INTRAVENOUS | Status: AC
  Administered 2014-11-24: 500 mL via INTRAVENOUS

## 2014-11-24 MED ORDER — LEVOFLOXACIN IN D5W 750 MG/150ML IV SOLN
750.0000 mg | INTRAVENOUS | Status: DC
  Administered 2014-11-24: 750 mg via INTRAVENOUS
  Filled 2014-11-24 (×2): qty 150

## 2014-11-24 MED ORDER — FUROSEMIDE 10 MG/ML IJ SOLN
10.0000 mg/h | INTRAVENOUS | Status: DC
  Administered 2014-11-24: 10 mg/h via INTRAVENOUS
  Filled 2014-11-24: qty 25

## 2014-11-24 MED ORDER — INSULIN ASPART 100 UNIT/ML ~~LOC~~ SOLN: 0.0000 [IU] | Freq: Three times a day (TID) | SUBCUTANEOUS | Status: DC

## 2014-11-24 MED ORDER — PRO-STAT SUGAR FREE PO LIQD
60.0000 mL | Freq: Every day | ORAL | Status: DC
  Administered 2014-11-24 – 2014-11-28 (×21): 60 mL
  Filled 2014-11-24 (×25): qty 60

## 2014-11-24 MED ORDER — INSULIN ASPART 100 UNIT/ML ~~LOC~~ SOLN
0.0000 [IU] | SUBCUTANEOUS | Status: DC
  Administered 2014-11-24: 11 [IU] via SUBCUTANEOUS
  Administered 2014-11-24 (×2): 5 [IU] via SUBCUTANEOUS
  Administered 2014-11-25 (×3): 11 [IU] via SUBCUTANEOUS

## 2014-11-24 MED ORDER — HYDRALAZINE HCL 20 MG/ML IJ SOLN: 5.0000 mg | INTRAMUSCULAR | Status: DC | PRN

## 2014-11-24 MED ORDER — LINEZOLID 600 MG/300ML IV SOLN
600.0000 mg | Freq: Two times a day (BID) | INTRAVENOUS | Status: DC
  Administered 2014-11-24 – 2014-11-26 (×5): 600 mg via INTRAVENOUS
  Filled 2014-11-24 (×6): qty 300

## 2014-11-24 MED ORDER — HYDRALAZINE HCL 20 MG/ML IJ SOLN: 5.0000 mg | INTRAMUSCULAR | Status: DC

## 2014-11-24 MED ORDER — SODIUM CHLORIDE 0.9 % IV SOLN
250.0000 mg | Freq: Four times a day (QID) | INTRAVENOUS | Status: AC
  Administered 2014-11-24 – 2014-11-25 (×3): 250 mg via INTRAVENOUS
  Filled 2014-11-24 (×5): qty 2

## 2014-11-24 MED ORDER — INSULIN ASPART 100 UNIT/ML ~~LOC~~ SOLN: 0.0000 [IU] | Freq: Every day | SUBCUTANEOUS | Status: DC

## 2014-11-24 NOTE — Progress Notes (Addendum)
Inpatient Diabetes Program Recommendations  AACE/ADA: New Consensus Statement on Inpatient Glycemic Control (2015)  Target Ranges:  Prepandial:   less than 140 mg/dL      Peak postprandial:   less than 180 mg/dL (1-2 hours)      Critically ill patients:  140 - 180 mg/dL    Results for Brandon Villarreal, Brandon Villarreal (MRN [id number]) as of 11/24/2014 09:35  Ref. Range 11/24/2014 00:14 11/24/2014 04:04 11/24/2014 07:58  Glucose-Capillary Latest Ref Range: 65-99 mg/dL 621215 (H) 308244 (H) 657221 (H)    Admit with: Pneumonia/ ARDS Protocol initiated  History: DM, HTN  Home DM Meds: Lantus 60 units bid       Glucovance (Glyburide/ Metformin) 5/500- 2 tablets bid  Current Insulin Orders: Lantus 60 units bid      -Note patient is currently intubated, sedated, paralyzed.    -Getting Oxepa tube feeds at 40cc/hour.  -Glucose levels consistently greater than 200 mg/dl since midnight.      MD- Please consider the following in-hospital insulin adjustments:  Start ICU Glycemic Control Protocol- Phase 1 (IV insulin drip)  Or  Please start Novolog Moderate SSI (0-15 units) Q4 hours +   Novolog 4 units Q4 hours (tube feed coverage) +   Continue Lantus 60 bid      --Will follow patient during hospitalization--  Ambrose FinlandJeannine Johnston Lakyia Behe RN, MSN, CDE Diabetes Coordinator Inpatient Glycemic Control Team Team Pager: [phone] (8a-5p)

## 2014-11-24 NOTE — Progress Notes (Signed)
RT and RN changed PT ETT holder. No complications. Vital signs stable throughout. Patient tolerated well. RN and family at bedside. RT will continue to monitor.

## 2014-11-24 NOTE — Progress Notes (Signed)
VASCULAR LAB PRELIMINARY  PRELIMINARY  PRELIMINARY  PRELIMINARY  Bilateral lower extremity venous duplex  completed.    Preliminary report:  Right:  No evidence of DVT, superficial thrombosis, or Baker's cyst.  Left:  No evidence of DVT or Baker's cyst.  Superficial thrombosis noted in the left LSV up to, but not in the popliteal vein.     Yemaya Barnier, RVT 11/24/2014, 4:04 PM

## 2014-11-24 NOTE — Progress Notes (Signed)
Patient placed in supine position from prone. No complications. Vitals stable throughout. Patient tolerated well. RT will continue to monitor.

## 2014-11-24 NOTE — Progress Notes (Signed)
eLink Physician-Brief Progress Note Patient Name: Cannon KettleRobert F Doty DOB: 07/04/1950 MRN: [id number]   Date of Service  11/24/2014  HPI/Events of Note  Leg pos   eICU Interventions  Add levofloxacin     Intervention Category Intermediate Interventions: Communication with other healthcare providers and/or family  Nelda BucksFEINSTEIN,Slayden Mennenga J. 11/24/2014, 3:57 PM

## 2014-11-24 NOTE — Progress Notes (Signed)
Initial Nutrition Assessment  DOCUMENTATION CODES:   Morbid obesity  INTERVENTION:    Continue TF via OGT with Oxepa at 40 ml/h (960 ml per day) add Prostat 60 ml 5 times per day to provide 2440 kcals, 210 gm protein, 754 ml free water daily.  NUTRITION DIAGNOSIS:   Inadequate oral intake related to inability to eat as evidenced by NPO status.  GOAL:   Provide needs based on ASPEN/SCCM guidelines  MONITOR:   TF tolerance, Weight trends, Labs, I & O's, Vent status  REASON FOR ASSESSMENT:   Consult Enteral/tube feeding initiation and management  ASSESSMENT:   64 y/o M, never smoker, with PMH of obesity, HTN, HLD, ED, venous stasis, arthritis, and DM [facility] with complaints of shortness of breath.Intubated on admission.  OGT in place with tip in the descending duodenum. Being treated for ARDS. Placed in prone position yesterday, changing to supine position this AM. TF protocol has been initiated and patient is currently receiving Oxepa at 40 ml/h (1440 kcals, 60 gm protein, 754 ml free water daily.  Patient is currently intubated on ventilator support Temp (24hrs), Avg:100.9 F (38.3 C), Min:99.6 F (37.6 C), Max:102 F (38.9 C)   Diet Order:  Diet NPO time specified  Skin:  Wound (see comment) (toe wound; old skin tear to leg)  Last BM:  10/21  Height:   Ht Readings from Last 1 Encounters:  08/20/2014 6\' 3"  (1.905 m)    Weight:   Wt Readings from Last 1 Encounters:  11/24/14 395 lb 1 oz (179.2 kg)    Ideal Body Weight:  89.1 kg  BMI:  Body mass index is 49.38 kg/(m^2).  Estimated Nutritional Needs:   Kcal:  [id number]  Protein:  190-220 gm  Fluid:  2-2.5 L  EDUCATION NEEDS:   No education needs identified at this time  Joaquin CourtsKimberly Harris, RD, LDN, CNSC Pager [phone] After Hours Pager [phone]

## 2014-11-24 NOTE — Progress Notes (Signed)
PULMONARY / CRITICAL CARE MEDICINE   Name: Brandon Villarreal MRN: [id number] DOB: 01/23/1951    ADMISSION DATE:  11/12/2014 CONSULTATION DATE:  11/06/2014  REFERRING MD :  Dr Ardyth HarpsHernandez / THR @[facility]   CHIEF COMPLAINT:  Acute Respiratory Failure   STUDIES:  10/22 CTA Chest >> neg for PE, diffuse bilateral central airspace opacification R>L with air bronchograms, diffuse coronary calcifications, healing minimally displaced fracture of R lateral 6th rib 10/22 ECHO >> EF 45-50%, mild diffuse hypokinesis 10/22 Admit with diffuse bilateral airspace disease. CT negative for PE. ARDS PROTOCOL with NIMBEX - severe ARDS  SIGNIFICANT EVENTS: 10/22 - Intubated, admitted  SUBJECTIVE:  Intubated, sedated.  VITAL SIGNS: Temp:  [99.5 F (37.5 C)-102 F (38.9 C)] 99.6 F (37.6 C) (10/24 0400) Pulse Rate:  [87-115] 102 (10/24 0748) Resp:  [0-36] 35 (10/24 0748) BP: (81-143)/(44-92) 143/61 mmHg (10/24 0748) SpO2:  [90 %-100 %] 100 % (10/24 0748) Arterial Line BP: (97-149)/(40-83) 149/57 mmHg (10/24 0700) FiO2 (%):  [90 %-100 %] 90 % (10/24 0749) Weight:  [375 lb 14.2 oz (170.5 kg)-395 lb 1 oz (179.2 kg)] 395 lb 1 oz (179.2 kg) (10/24 0328) HEMODYNAMICS:   VENTILATOR SETTINGS: Vent Mode:  [-] PRVC FiO2 (%):  [90 %-100 %] 90 % Set Rate:  [35 bmp] 35 bmp Vt Set:  [510 mL] 510 mL PEEP:  [18 cmH20-22 cmH20] 18 cmH20 Plateau Pressure:  [31 cmH20-38 cmH20] 31 cmH20 INTAKE / OUTPUT:  Intake/Output Summary (Last 24 hours) at 11/24/14 0813 Last data filed at 11/24/14 0600  Gross per 24 hour  Intake 4534.58 ml  Output    600 ml  Net 3934.58 ml    PHYSICAL EXAMINATION: General:  64yo male, intubated Neuro:  Sedated, paralyzed HEENT:  OETT, MMM Cardiovascular:  S1s2, RRR Lungs:  Diffuse coarse breath sounds Abdomen:  Obese, +BS, S, ND, NT Musculoskeletal:  No acute deformities Skin:  Dry, 2-3+ pitting edema in BLE, R great toe with small annular open area without drainage    LABS:  CBC  Recent Labs Lab 11/02/2014 0215 11/23/14 0532  WBC 8.5 7.0  HGB 13.5 11.9*  HCT 37.9* 35.9*  PLT 132* 127*   Coag's No results for input(s): APTT, INR in the last 168 hours. BMET  Recent Labs Lab 11/09/2014 0215 11/23/14 0532  NA 131* 131*  K 3.1* 3.7  CL 97* 101  CO2 23 22  BUN 17 22*  CREATININE 0.70 1.09  GLUCOSE 236* 186*   Electrolytes  Recent Labs Lab 11/20/2014 0215 11/23/14 0532  CALCIUM 9.0 7.5*  MG  --  1.4*  PHOS  --  3.4   Sepsis Markers  Recent Labs Lab 11/16/2014 0226 11/11/2014 0518 11/05/2014 2143 11/03/2014 2151 11/23/14 0532  LATICACIDVEN 2.16* 2.18*  --  0.9  --   PROCALCITON  --   --  1.80  --  1.82   ABG  Recent Labs Lab 11/23/14 1058 11/23/14 1850 11/23/14 2305  PHART 7.198* 7.224* 7.251*  PCO2ART 56.9* 55.3* 51.0*  PO2ART 55.0* 87.4 134*   Liver Enzymes  Recent Labs Lab 11/13/2014 0215 11/23/14 0532  AST 72* 44*  ALT 70* 53  ALKPHOS 123 88  BILITOT 2.3* 2.2*  ALBUMIN 2.9* 1.9*   Cardiac Enzymes  Recent Labs Lab 11/05/2014 2143 11/23/14 0532 11/23/14 1145  TROPONINI 0.10* 0.05* 0.03   Glucose  Recent Labs Lab 11/06/2014 1944 11/27/2014 2345 11/23/14 0828 11/23/14 1219 11/23/14 2009 11/24/14 0014  GLUCAP 145* 141* 196* 178* 199* 215*  Imaging Dg Chest Port 1 View  11/23/2014  CLINICAL DATA:  Intubation, ventilatory support EXAM: PORTABLE CHEST 1 VIEW COMPARISON:  11/23/2014 FINDINGS: Limited exam because of body habitus and portable technique. Endotracheal tube approximately 4 cm above the carina. NG tube extends below the hemidiaphragms into the stomach with the tip not visualized. Patchy consolidative bilateral airspace process persist throughout both lungs, little interval change. Limited assessment of the left lower lobe because of positioning. No large pneumothorax. IMPRESSION: No significant change in severe bilateral consolidative airspace process compatible with pneumonia versus ARDS.  Electronically Signed   By: Judie Petit.  Shick M.D.   On: 11/23/2014 10:17   Dg Abd Portable 1v  11/23/2014  CLINICAL DATA:  OG tube placement. EXAM: PORTABLE ABDOMEN - 1 VIEW COMPARISON:  None. FINDINGS: An OG tube is identified with tip overlying the descending duodenum. The bowel gas pattern is unremarkable. IMPRESSION: OG tube with tip overlying the descending duodenum. Electronically Signed   By: Harmon Pier M.D.   On: 11/23/2014 10:48     ASSESSMENT / PLAN:   PULMONARY OETT 10/22 >>  A: Acute Hypoxic Respiratory Failure - secondary to diffuse R>L bilateral infiltrates  Multi-focal PNA / CAP -  ARDS - severe Pa/FiO2 ratio 60  - 11/23/2014: Unchanged despite nearly 24 hours of intubation and > 12 hours of ARDS protocol with Nimbex  P:  ARDS protocol with Nimbex since 11/18/2014 p.m. Start prone ventilation 18 hours prone +6 hours supine - specific orders written Wean PEEP/FiO2 for sats > 92% CXR with position change Assess ABG with position change  Consider FOB for sputum sampling, eosinophils when more stable given peripheral sparing Droplet precautions   CARDIOVASCULAR CVL L FEM 10/22 >>  A:  Soft/Normal BP - suspect sedation related. On Lasix at home  R/O CHF  Hx HTN/HLD Venous Stasis  - Echo with 45-50% EF  P:  Begin daily diuresis aggressive given normal blood pressure Daily ASA Hold benazepril with AKI risk  Hold norvasc, cardura, fenofibrate May need levophed if MAP <65  RENAL A:  hypomagnesemia  P:  Replete magnesium Fluids to KVO Trend BMP / UOP  Replace electrolytes as indicated  NS at [facility]  GASTROINTESTINAL A:  Vent Associated Dysphagia - intubated for respiratory failure  P:  NPO Place OGT Begin TF - caution during position change with prone ventilation  HEMATOLOGIC A:  No acute process  P:  Heparin for DVT prophylaxis  Transfuse per ICU guidelines  INFECTIOUS A:  Fever R/O Flu Sepsis  Multi-Focal  Airspace Disease - concerning for CAP  P:  BCx2 10/22 >>  UC 10/22 >>  Sputum 10/22 >>  Influenza 10/22 >> neg  Influenza tracheal aspirate 10/22 >>  Respiratory virus panel tracheal aspirate October 22>>  Vanc 10/22 >>> Zosyn 10/22 >>>  ENDOCRINE A:  DM  P:  SSI  Lantus 60 units BID (reduced from home dose of 100 BID) Hold home glyburide-metformin   NEUROLOGIC A:  Sedation needs  P:  RASS goal: -4/BIS goal < 50 with fent/versed Neuromuscular blockade since 11/04/2014 pm ARDS protocol  Fentanyl gtt / Versed gtts  FAMILY  - Updates: Family updated at bedside at admission 11/27/2014. Full code. None at bedside 11/23/2014   - Inter-disciplinary family meet or Palliative Care meeting due by: 10/30  Katina Degree. Jimmey Ralph, MD [facility] Resident PGY-2 11/24/2014 8:13 AM   Attending Note:  64 year old male with PMH of diabetes and HTN presenting with bilateral pulmonary infiltrate  and ARDS.  Diffuse coarse BS diffusely.  Now developing renal failure and failure to improve from a respiratory standpoint.  Will d/c scheduled lasix, place on a lasix drip, KVO IVF, start zyvox, will replace line today and bronch, if no puss seen on bronchoscopy will start high dose steroids, autoimmune panel and will f/u on culture.    The patient is critically ill with multiple organ systems failure and requires high complexity decision making for assessment and support, frequent evaluation and titration of therapies, application of advanced monitoring technologies and extensive interpretation of multiple databases.   Critical Care Time devoted to patient care services described in this note is  35  Minutes. This time reflects time of care of this signee Dr Koren Bound. This critical care time does not reflect procedure time, or teaching time or supervisory time of PA/NP/Med student/Med Resident etc but could involve care discussion time.  Alyson Reedy,  M.D. [facility] Pulmonary/Critical Care Medicine. Pager: [phone]. After hours pager: [phone].

## 2014-11-24 NOTE — Progress Notes (Signed)
eLink Physician-Brief Progress Note Patient Name: Brandon KettleRobert F Villarreal DOB: 06/21/1950 MRN: [id number]   Date of Service  11/24/2014  HPI/Events of Note  MAP 57  eICU Interventions  cvp 10, vobolus May need pressors Get abg     Intervention Category Intermediate Interventions: Hypotension - evaluation and management  Nelda BucksFEINSTEIN,Alferd Obryant J. 11/24/2014, 7:29 PM

## 2014-11-24 NOTE — Procedures (Signed)
Bronchoscopy Procedure Note Cannon KettleRobert F Kotlarz [id number] 04/26/1950  Procedure: Bronchoscopy Indications: Diagnostic evaluation of the airways and Obtain specimens for culture and/or other diagnostic studies  Procedure Details Consent: Risks of procedure as well as the alternatives and risks of each were explained to the (patient/caregiver).  Consent for procedure obtained. Time Out: Verified patient identification, verified procedure, site/side was marked, verified correct patient position, special equipment/implants available, medications/allergies/relevent history reviewed, required imaging and test results available.  Performed  In preparation for procedure, patient was given 100% FiO2 and bronchoscope lubricated. Sedation: Benzodiazepines, Muscle relaxants and Fentanyl  Airway entered and the following bronchi were examined: RUL, RML, RLL, LUL, LLL and Bronchi.   Bronchoscope removed.    Evaluation Hemodynamic Status: BP stable throughout; O2 sats: stable throughout Patient's Current Condition: stable Specimens:  Sent serosanguinous fluid Complications: No apparent complications Patient did tolerate procedure well.   Koren BoundYACOUB,WESAM 11/24/2014

## 2014-11-24 NOTE — Progress Notes (Addendum)
ANTIBIOTIC CONSULT NOTE-  Pharmacy Consult for Vancomycin and Zosyn Indication: Sepsis  No Known Allergies Patient Measurements: Height: 6\' 3"  (190.5 cm) Weight: (!) 395 lb 1 oz (179.2 kg) IBW/kg (Calculated) : 84.5 Vital Signs: Temp: 99.6 F (37.6 C) (10/24 0400) Temp Source: Rectal (10/24 0400) BP: 115/51 mmHg (10/24 0800) Pulse Rate: 110 (10/24 0845)  Labs:  Recent Labs  11/18/2014 0215 11/23/14 0532  WBC 8.5 7.0  HGB 13.5 11.9*  PLT 132* 127*  CREATININE 0.70 1.09   Called Lab 10/24 -Tubed following results: Vancomycin trough 23 (drawn 0405AM), K 5, SCr 2.03, PCT 2.98, no CBC  Estimated Creatinine Clearance: 120.1 mL/min (by C-G formula based on Cr of 1.09).  Assessment: 64 yo male with symptoms of fever, cough, SOB. WBCs normal; elevated D-dimer. Ct shows evidence of multifocal pneumonia.    Per labs today (tubed results from Lab), SCr doubled overnight up to 2 on Vancomycin + Zosyn, Lasix, and Lovenox. No cbc was drawn this AM. PCT is elevated. Cultures are pending.   Vanc 10/22> Zosyn 10/22>  10/22 Vanc 2.5g load, then 1.5g q12h 10/24 VT = 23 on 1.5g q12h (collected 04:05; RN gave next dose at 0525)  10/22 BCx ngtd 10/22 Urine: 10/22 Trach Asp: 10/22 RVP: IP 10/22 Legionella IP 10/22 Strep pneumo neg 10/22 Flu negative  Goal of Therapy:  Vancomycin troughs 15-20 mcg/ml Eradicate infection  Plan:  Zosyn 3.375 GM IV every 8 hours, each dose over 4 hours Due to doubling of Scr & 1.5g given this AM after elevated level, hold further doses today and resume in AM after assessment of SCr & VR in am  Monitor renal function Labs per protocol   Link SnufferJessica Victorious Kundinger, PharmD, BCPS Clinical Pharmacist [phone]  11/24/2014,9:10 AM   Addendum: Vancomycin changed to Zyvox per ID recommendations. Cancelled vancomycin level in AM. JBM, 10:18 AM

## 2014-11-24 NOTE — Procedures (Signed)
Central Venous Catheter Insertion Procedure Note Brandon KettleRobert F Villarreal [id number] 10/24/1950  Procedure: Insertion of Central Venous Catheter Indications: Drug and/or fluid administration  Procedure Details Consent: Risks of procedure as well as the alternatives and risks of each were explained to the (patient/caregiver).  Consent for procedure obtained. Time Out: Verified patient identification, verified procedure, site/side was marked, verified correct patient position, special equipment/implants available, medications/allergies/relevent history reviewed, required imaging and test results available.  Performed  Maximum sterile technique was used including antiseptics, cap, gloves, gown, hand hygiene, mask and sheet. Skin prep: Chlorhexidine; local anesthetic administered A antimicrobial bonded/coated triple lumen catheter was placed in the right internal jugular vein using the Seldinger technique.  Evaluation Blood flow good Complications: No apparent complications Patient did tolerate procedure well. Chest X-ray ordered to verify placement.  CXR: pending.  U/S used in placement.  Brandon Villarreal 11/24/2014, 12:08 PM

## 2014-11-25 ENCOUNTER — Inpatient Hospital Stay (HOSPITAL_COMMUNITY): Payer: BLUE CROSS/BLUE SHIELD

## 2014-11-25 DIAGNOSIS — N179 Acute kidney failure, unspecified: Secondary | ICD-10-CM | POA: Insufficient documentation

## 2014-11-25 LAB — BASIC METABOLIC PANEL
Anion gap: 6 (ref 5–15)
BUN: 55 mg/dL — AB (ref 6–20)
CHLORIDE: 102 mmol/L (ref 101–111)
CO2: 26 mmol/L (ref 22–32)
Calcium: 7.2 mg/dL — ABNORMAL LOW (ref 8.9–10.3)
Creatinine, Ser: 2.49 mg/dL — ABNORMAL HIGH (ref 0.61–1.24)
GFR calc Af Amer: 30 mL/min — ABNORMAL LOW (ref >60)
GFR calc non Af Amer: 26 mL/min — ABNORMAL LOW (ref >60)
GLUCOSE: 349 mg/dL — AB (ref 65–99)
POTASSIUM: 5.2 mmol/L — AB (ref 3.5–5.1)
Sodium: 134 mmol/L — ABNORMAL LOW (ref 135–145)

## 2014-11-25 LAB — RESPIRATORY VIRUS PANEL
ADENOVIRUS: NEGATIVE
Influenza A: NEGATIVE
Influenza B: NEGATIVE
METAPNEUMOVIRUS: NEGATIVE
Parainfluenza 1: NEGATIVE
Parainfluenza 2: NEGATIVE
Parainfluenza 3: NEGATIVE
RESPIRATORY SYNCYTIAL VIRUS A: NEGATIVE
RESPIRATORY SYNCYTIAL VIRUS B: NEGATIVE
Rhinovirus: NEGATIVE

## 2014-11-25 LAB — ANCA TITERS

## 2014-11-25 LAB — GLUCOSE, CAPILLARY
Glucose-Capillary: 315 mg/dL — ABNORMAL HIGH (ref 65–99)
Glucose-Capillary: 331 mg/dL — ABNORMAL HIGH (ref 65–99)

## 2014-11-25 LAB — BLOOD GAS, ARTERIAL
ACID-BASE DEFICIT: 3.3 mmol/L — AB (ref 0.0–2.0)
Bicarbonate: 23.1 mEq/L (ref 20.0–24.0)
DRAWN BY: 437071
FIO2: 1
O2 SAT: 92.9 %
PCO2 ART: 55.5 mmHg — AB (ref 35.0–45.0)
PEEP/CPAP: 22 cmH2O
PH ART: 7.241 — AB (ref 7.350–7.450)
Patient temperature: 98
RATE: 35 resp/min
TCO2: 24.8 mmol/L (ref 0–100)
VT: 510 mL
pO2, Arterial: 68.8 mmHg — ABNORMAL LOW (ref 80.0–100.0)

## 2014-11-25 LAB — RENAL FUNCTION PANEL
ANION GAP: 8 (ref 5–15)
Albumin: 1.5 g/dL — ABNORMAL LOW (ref 3.5–5.0)
BUN: 72 mg/dL — ABNORMAL HIGH (ref 6–20)
CALCIUM: 7 mg/dL — AB (ref 8.9–10.3)
CHLORIDE: 108 mmol/L (ref 101–111)
CO2: 24 mmol/L (ref 22–32)
Creatinine, Ser: 2.56 mg/dL — ABNORMAL HIGH (ref 0.61–1.24)
GFR calc non Af Amer: 25 mL/min — ABNORMAL LOW (ref >60)
GFR, EST AFRICAN AMERICAN: 29 mL/min — AB (ref >60)
GLUCOSE: 364 mg/dL — AB (ref 65–99)
Phosphorus: 4.4 mg/dL (ref 2.5–4.6)
Potassium: 5.2 mmol/L — ABNORMAL HIGH (ref 3.5–5.1)
SODIUM: 140 mmol/L (ref 135–145)

## 2014-11-25 LAB — PHOSPHORUS: Phosphorus: 3.6 mg/dL (ref 2.5–4.6)

## 2014-11-25 LAB — CULTURE, RESPIRATORY

## 2014-11-25 LAB — CULTURE, RESPIRATORY W GRAM STAIN: Culture: NO GROWTH

## 2014-11-25 LAB — MAGNESIUM: Magnesium: 2.1 mg/dL (ref 1.7–2.4)

## 2014-11-25 LAB — CBC
HEMATOCRIT: 34.4 % — AB (ref 39.0–52.0)
HEMOGLOBIN: 11.1 g/dL — AB (ref 13.0–17.0)
MCH: 27.5 pg (ref 26.0–34.0)
MCHC: 32.3 g/dL (ref 30.0–36.0)
MCV: 85.1 fL (ref 78.0–100.0)
Platelets: 156 10*3/uL (ref 150–400)
RBC: 4.04 MIL/uL — AB (ref 4.22–5.81)
RDW: 16.3 % — ABNORMAL HIGH (ref 11.5–15.5)
WBC: 4 10*3/uL (ref 4.0–10.5)

## 2014-11-25 MED ORDER — SODIUM CHLORIDE 0.9 % IJ SOLN
250.0000 [IU]/h | INTRAMUSCULAR | Status: DC
  Administered 2014-11-25: 1150 [IU]/h via INTRAVENOUS_CENTRAL
  Administered 2014-11-25: 250 [IU]/h via INTRAVENOUS_CENTRAL
  Administered 2014-11-26 (×3): 2400 [IU]/h via INTRAVENOUS_CENTRAL
  Administered 2014-11-26: 1850 [IU]/h via INTRAVENOUS_CENTRAL
  Administered 2014-11-27 – 2014-11-29 (×15): 2500 [IU]/h via INTRAVENOUS_CENTRAL
  Filled 2014-11-25 (×25): qty 2

## 2014-11-25 MED ORDER — HEPARIN BOLUS VIA INFUSION (CRRT)
1000.0000 [IU] | INTRAVENOUS | Status: DC | PRN
  Administered 2014-11-25: 1000 [IU] via INTRAVENOUS_CENTRAL
  Filled 2014-11-25 (×2): qty 1000

## 2014-11-25 MED ORDER — PRISMASOL BGK 4/2.5 32-4-2.5 MEQ/L IV SOLN
INTRAVENOUS | Status: DC
  Administered 2014-11-25 – 2014-11-28 (×4): via INTRAVENOUS_CENTRAL
  Filled 2014-11-25 (×6): qty 5000

## 2014-11-25 MED ORDER — INSULIN ASPART 100 UNIT/ML ~~LOC~~ SOLN
0.0000 [IU] | SUBCUTANEOUS | Status: DC
  Administered 2014-11-25: 15 [IU] via SUBCUTANEOUS
  Administered 2014-11-25: 20 [IU] via SUBCUTANEOUS

## 2014-11-25 MED ORDER — PIPERACILLIN-TAZOBACTAM IN DEX 2-0.25 GM/50ML IV SOLN
2.2500 g | Freq: Four times a day (QID) | INTRAVENOUS | Status: DC
  Administered 2014-11-25 – 2014-11-26 (×4): 2.25 g via INTRAVENOUS
  Filled 2014-11-25 (×7): qty 50

## 2014-11-25 MED ORDER — FUROSEMIDE 10 MG/ML IJ SOLN
10.0000 mg/h | INTRAVENOUS | Status: DC
  Administered 2014-11-25: 10 mg/h via INTRAVENOUS
  Filled 2014-11-25: qty 25

## 2014-11-25 MED ORDER — FENTANYL CITRATE (PF) 2500 MCG/50ML IJ SOLN
25.0000 ug/h | Status: DC
  Administered 2014-11-25 – 2014-11-27 (×3): 300 ug/h via INTRAVENOUS
  Administered 2014-11-27: 250 ug/h via INTRAVENOUS
  Administered 2014-11-28 – 2014-11-29 (×2): 300 ug/h via INTRAVENOUS
  Filled 2014-11-25 (×6): qty 100

## 2014-11-25 MED ORDER — SODIUM CHLORIDE 0.9 % IV SOLN
INTRAVENOUS | Status: DC
  Administered 2014-11-25: 2.6 [IU]/h via INTRAVENOUS
  Filled 2014-11-25 (×2): qty 2.5

## 2014-11-25 MED ORDER — PRISMASOL BGK 4/2.5 32-4-2.5 MEQ/L IV SOLN
INTRAVENOUS | Status: DC
  Administered 2014-11-25 – 2014-11-29 (×17): via INTRAVENOUS_CENTRAL
  Filled 2014-11-25 (×25): qty 5000

## 2014-11-25 MED ORDER — PRISMASOL BGK 4/2.5 32-4-2.5 MEQ/L IV SOLN
INTRAVENOUS | Status: DC
  Administered 2014-11-25 – 2014-11-29 (×5): via INTRAVENOUS_CENTRAL
  Filled 2014-11-25 (×7): qty 5000

## 2014-11-25 MED ORDER — METOLAZONE 5 MG PO TABS
5.0000 mg | ORAL_TABLET | Freq: Every day | ORAL | Status: DC
  Filled 2014-11-25: qty 1

## 2014-11-25 MED ORDER — HEPARIN SODIUM (PORCINE) 1000 UNIT/ML DIALYSIS
1000.0000 [IU] | INTRAMUSCULAR | Status: DC | PRN
  Filled 2014-11-25 (×2): qty 6

## 2014-11-25 MED ORDER — INSULIN GLARGINE 100 UNIT/ML ~~LOC~~ SOLN
75.0000 [IU] | Freq: Two times a day (BID) | SUBCUTANEOUS | Status: DC
  Administered 2014-11-25: 75 [IU] via SUBCUTANEOUS
  Filled 2014-11-25 (×2): qty 0.75

## 2014-11-25 MED ORDER — HEPARIN SODIUM (PORCINE) 5000 UNIT/ML IJ SOLN
5000.0000 [IU] | Freq: Three times a day (TID) | INTRAMUSCULAR | Status: DC
  Administered 2014-11-25 – 2014-11-29 (×12): 5000 [IU] via SUBCUTANEOUS
  Filled 2014-11-25 (×14): qty 1

## 2014-11-25 MED ORDER — HEPARIN (PORCINE) 2000 UNITS/L FOR CRRT
INTRAVENOUS_CENTRAL | Status: DC | PRN
  Filled 2014-11-25: qty 1000

## 2014-11-25 MED ORDER — LEVOFLOXACIN IN D5W 500 MG/100ML IV SOLN
500.0000 mg | INTRAVENOUS | Status: DC
  Administered 2014-11-25 – 2014-11-27 (×3): 500 mg via INTRAVENOUS
  Filled 2014-11-25 (×4): qty 100

## 2014-11-25 NOTE — Consult Note (Signed)
Requesting Physician:  Dr. Onalee Hua Reason for Consult:  Oliguric AKI HPI: The patient is a 64 y.o. year-old WM with a background of obesity, HTN, venous stasis, arthritis, DM. Presented to [facility] on 10/22 with several days of worsening SOB, cough, fever. Also had some exposure to acrylic material from wife making Christmas ornaments. Was found to he hypoxic. CT chest (with IV contrast) negative for PE but + widespread ASDx. Renal function was normal on admission, but creatinine has risen progressively. Was initially making urine, but has become oliguric over the course of today despite a lasix drip. Requiring 100% oxygen for multifocal PNA/CAP. Legionella urinary antigen +.  On ATB's.  We are asked to provide CRRT.   CREATININE, SER  Date/Time Value Ref Range Status  11/25/2014 04:19 AM 2.49* 0.61 - 1.24 mg/dL Final  11/24/2014 09:59 AM 2.05* 0.61 - 1.24 mg/dL Final  11/24/2014 04:05 AM 2.03* 0.61 - 1.24 mg/dL Final  11/23/2014 05:32 AM 1.09 0.61 - 1.24 mg/dL Final  11/28/2014 02:15 AM 0.70 0.61 - 1.24 mg/dL Final  08/02/2013 06:55 PM 1.00 0.50 - 1.35 mg/dL Final  07/30/2013 06:50 AM 0.92 0.50 - 1.35 mg/dL Final  07/29/2013 03:28 PM 0.71 0.50 - 1.35 mg/dL Final  07/19/2013 08:27 AM 0.60 0.50 - 1.35 mg/dL Final  02/04/2011 03:44 PM 0.59 0.50 - 1.35 mg/dL Final  09/06/2006 01:10 PM 0.66  Final     Past Medical History  Diagnosis Date  . Hypertension   . Hyperlipidemia   . ED (erectile dysfunction)   . Obesity   . 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 Microproteinuria   . Venous stasis   . Diabetes mellitus     fasting 90-120  . Arthritis      Past Surgical History  Procedure Laterality Date  . Replacement total knee      left  . Back surgery      lumbar disc  . Hammer toe surgery  02/10/2011    Procedure: HAMMER TOE CORRECTION;  Surgeon: Marcheta Grammes;  Location: [facility];  Service: Orthopedics;  Laterality: Right;  Percutaneous Flexor Tenotomy third Toe Right Foot  . Joint  replacement Left     knee  . Hernia repair      umbilical  . 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 Anesthesia problems Neg Hx   . Hypotension Neg Hx   . Malignant hyperthermia Neg Hx   . Pseudochol deficiency Neg Hx    Social History:  reports that he has never smoked. He does not have any smokeless tobacco history on file. He reports that he does not drink alcohol or use illicit drugs.  Allergies: No Known Allergies  Home medications: Prior to Admission medications   Medication Sig Start Date End Date Taking? Authorizing Provider  amLODipine (NORVASC) 10 MG tablet Take 0.5 tablets (5 mg total) by mouth 2 (two) times daily. 06/11/14  Yes Mikey Kirschner, MD  aspirin 81 MG tablet Take 81 mg by mouth daily.   Yes Historical Provider, MD  benazepril (LOTENSIN) 40 MG tablet Take 1 tablet (40 mg total) by mouth daily. 06/11/14  Yes Mikey Kirschner, MD  doxazosin (CARDURA) 2 MG tablet Take 1 tablet (2 mg total) by mouth daily. 06/11/14  Yes Mikey Kirschner, MD  fenofibrate 160 MG  tablet Take 1 tablet (160 mg total) by mouth daily. 06/11/14  Yes Mikey Kirschner, MD  furosemide (LASIX) 40 MG tablet Take 1 tablet (40 mg total) by mouth daily. 06/11/14  Yes Mikey Kirschner, MD  glyBURIDE-metformin (GLUCOVANCE) 5-500 MG per tablet Take 2 tablets by mouth 2 (two) times daily with a meal. 06/11/14  Yes Mikey Kirschner, MD  ibuprofen (ADVIL,MOTRIN) 200 MG tablet Take 400 mg by mouth every 6 (six) hours as needed for moderate pain.   Yes Historical Provider, MD  insulin glargine (LANTUS) 100 UNIT/ML injection Inject 0.6 mLs (60 Units total) into the skin 2 (two) times daily. 06/11/14  Yes Mikey Kirschner, MD  potassium chloride SA (K-DUR,KLOR-CON) 20 MEQ tablet TAKE ONE TABLET BY MOUTH EVERY DAY 06/11/14  Yes Mikey Kirschner, MD  pravastatin (PRAVACHOL) 20 MG tablet Take 1 tablet (20 mg total) by mouth every evening. 06/11/14 06/11/15 Yes Mikey Kirschner, MD  blood glucose meter kit and supplies KIT Test blood sugar once daily as directed for diabetes type 2. Code E11.9. 02/26/14   Mikey Kirschner, MD    Inpatient medications: . antiseptic oral rinse  7 mL Mouth Rinse QID  . artificial tears  1 application Both Eyes 3 times per day  . aspirin  81 mg Oral Daily  . chlorhexidine gluconate  15 mL Mouth Rinse BID  . feeding supplement (OXEPA)  1,000 mL Per Tube Q24H  . feeding supplement (PRO-STAT SUGAR FREE 64)  60 mL Per Tube 5 X Daily  . fentaNYL (SUBLIMAZE) injection  100 mcg Intravenous Once  . heparin subcutaneous  5,000 Units Subcutaneous 3 times per day  . Influenza vac split quadrivalent PF  0.5 mL Intramuscular Tomorrow-1000  . insulin aspart  0-20 Units Subcutaneous 6 times per day  . insulin glargine  75 Units Subcutaneous BID  . levofloxacin (LEVAQUIN) IV  750 mg Intravenous Q24H  . linezolid  600 mg Intravenous Q12H  . metolazone  5 mg Oral Daily  . midazolam  2 mg Intravenous Once  . pantoprazole (PROTONIX) IV  40 mg Intravenous QHS  . piperacillin-tazobactam (ZOSYN)  IV  3.375 g Intravenous Q8H  . pneumococcal 23 valent vaccine  0.5 mL Intramuscular Tomorrow-1000  . pravastatin  20 mg Oral QPM  . sodium chloride  3 mL Intravenous Q12H    Review of Systems Unobtainable pt intubated     Physical Exam:  BP 101/56 mmHg  Pulse 70  Temp(Src) 97.5 F (36.4 C) (Rectal)  Resp 35  Ht _0  (1.905 m)  Wt 185.1 kg (408 lb 1.1 oz)  BMI 51.01 kg/m2  SpO2 93%  Gen: Ruddy complected, large framed WM. Intubated. OG tube. R IJ 3L. Skin: no rash. + old hemosiderin changes bilat LE's Neck: Large neck and cannont vis neck veins Chest: Anteriorly fairly clear Heart: Tachy S1S2 No S3 Heart sounds distant Abdomen: Obese. Large pannus. No palpable organomegaly Ext: Hemosiderin changes LE's  1-2+  LE edema. Neuro: Intubated and sedated   Labs: Basic Metabolic Panel:  Recent Labs Lab 11/03/2014 0215 11/23/14 0532 11/24/14 0405 11/24/14 0959 11/25/14 0419  NA 131* 131* 137 137 134*  K 3.1* 3.7 5.0 4.6 5.2*  CL 97* 101 106 103 102  CO2 _1 GLUCOSE 236* 186* 263* 246* 349*  BUN 17 22* 38* 39* 55*  CREATININE 0.70 1.09 2.03* 2.05* 2.49*  CALCIUM 9.0 7.5* 7.4* 7.6* 7.2*  PHOS  --  3.4  3.6  --  3.6     Recent Labs Lab 11/14/2014 0215 11/23/14 0532 11/24/14 0959  AST 72* 44* 85*  ALT 70* 53 60  ALKPHOS 123 88 97  BILITOT 2.3* 2.2* 2.3*  PROT 6.3* 4.7* 4.8*  ALBUMIN 2.9* 1.9* 1.8*     Recent Labs Lab 11/24/2014 0215 11/25/2014 0720 11/23/14 0532 11/24/14 0959 11/25/14 0419  WBC 8.5  --  7.0 7.4 4.0  NEUTROABS 7.5 6.5  --   --   --   HGB 13.5  --  11.9* 11.9* 11.1*  HCT 37.9*  --  35.9* 34.8* 34.4*  MCV 80.6  --  82.7 83.1 85.1  PLT 132*  --  127* 151 156     Recent Labs Lab 11/19/2014 2143 11/23/14 0532 11/23/14 1145  TROPONINI 0.10* 0.05* 0.03   CBG:  Recent Labs Lab 11/24/14 0758 11/24/14 1125 11/24/14 1527 11/24/14 1959 11/25/14 0403  GLUCAP 221* 225* 212* 309* 315*     Xrays/Other Studies: Dg Chest Port 1 View  11/25/2014  CLINICAL DATA:  Hypoxia EXAM: PORTABLE CHEST 1 VIEW COMPARISON:  November 24, 2014 FINDINGS: Endotracheal tube tip is 5.1 cm above the carina. Nasogastric tube tip and side port are below the diaphragm. No pneumothorax. Widespread airspace consolidation bilaterally is stable. No new opacity. Heart is prominent but stable. Pulmonary vascularity is within normal limits. IMPRESSION: Tube positions as described without pneumothorax. Stable widespread airspace consolidation bilaterally. No change in cardiac silhouette. Electronically Signed   By: Lowella Grip III M.D.   On: 11/25/2014 07:23   Dg Chest Port 1 View  11/24/2014  CLINICAL DATA:  Hypoxia with central catheter placement EXAM: PORTABLE CHEST 1 VIEW  COMPARISON:  Study obtained earlier in the day FINDINGS: Central catheter tip is in the superior vena cava near the cavoatrial junction. Endotracheal tube tip is 5.2 cm above the carina. Nasogastric tube tip and side port are below the diaphragm. No pneumothorax. Widespread airspace consolidation persists without change. Heart is upper normal in size with pulmonary vascularity within normal limits. IMPRESSION: Central catheter tip is in the superior vena cava near the cavoatrial junction. Otherwise no change compared to earlier in the day. No pneumothorax apparent. Electronically Signed   By: Lowella Grip III M.D.   On: 11/24/2014 11:36   Dg Chest Port 1 View  11/24/2014  CLINICAL DATA:  Hypoxia EXAM: PORTABLE CHEST 1 VIEW COMPARISON:  November 23, 2014 FINDINGS: Endotracheal tube tip is 3.5 cm above the carina. Nasogastric tube tip and side port are below the diaphragm. No pneumothorax. Widespread airspace consolidation bilaterally is again noted with overall slight increase bilaterally compared to 1 day prior. Heart is upper normal in size with pulmonary vascularity within normal limits. No adenopathy apparent. IMPRESSION: Tube positions as described without pneumothorax. Widespread airspace consolidation, slightly increased compared to 1 day prior. Electronically Signed   By: Lowella Grip III M.D.   On: 11/24/2014 10:59   Impression/Plan 1. Oliguric AKI in setting of sepsis/ARDS/+ legionella/IV contrast/(plus ACE PTA)  - oliguric on lasix drip. Needs volume off. Not on pressors, but probably best served by CRRT.  Will initiate with -200/hour off. Stop lasix drip. CCM to place catheter. 2. ARDS/CAP/VDRF/+ for legionella - ATB's per CCM 3. DM - per primary service 4. Obesity   Jamal Maes,  MD [facility] [phone] pager 11/25/2014, 11:05 AM

## 2014-11-25 NOTE — Progress Notes (Signed)
CRITICAL VALUE ALERT  Critical value received:  Blood Gas- pCO2 55.5  Date of notification:  10/25  Time of notification:  0430  Critical value read back:Yes.    Nurse who received alert:  Fuller CanadaAmy Deena Shaub RN  MD notified (1st page):  McQuaid MD  Time of first page:  0431  MD notified (2nd page):  Time of second page:  Responding MD:  Kendrick FriesMcQuaid MD  Time MD responded:  [phone]

## 2014-11-25 NOTE — Progress Notes (Signed)
eLink Physician-Brief Progress Note Patient Name: Brandon KettleRobert F Zell DOB: 01/31/1951 MRN: [id number]   Date of Service  11/25/2014  HPI/Events of Note  Persistent hyperglycemia, RN requesting insulin gtt Currently on bicarb gtt with D5 at 75cc/hr, has permissive hypercapnea  eICU Interventions  D/c bicarb gtt Monitor glucose and abg     Intervention Category Intermediate Interventions: Hyperglycemia - evaluation and treatment  Max FickleDouglas McQuaid 11/25/2014, 12:39 AM

## 2014-11-25 NOTE — Progress Notes (Signed)
eLink Physician-Brief Progress Note Patient Name: Brandon KettleRobert F Villarreal DOB: 05/25/1950 MRN: [id number]   Date of Service  11/25/2014  HPI/Events of Note  Persistent hyperglycemia.  eICU Interventions  Will change to insulin gtt.     Intervention Category Major Interventions: Other:  Brandon Villarreal 11/25/2014, 5:01 PM

## 2014-11-25 NOTE — Progress Notes (Signed)
ANTIBIOTIC CONSULT NOTE-  Pharmacy Consult for Zosyn, Levaquin Indication: Sepsis  No Known Allergies Patient Measurements: Height: 6\' 3"  (190.5 cm) Weight: (!) 408 lb 1.1 oz (185.1 kg) IBW/kg (Calculated) : 84.5 Vital Signs: Temp: 97.5 F (36.4 C) (10/25 1145) Temp Source: Rectal (10/25 1145) BP: 105/55 mmHg (10/25 1200) Pulse Rate: 69 (10/25 1230)  Labs:  Recent Labs  11/23/14 0532 11/24/14 0405 11/24/14 0959 11/25/14 0419  WBC 7.0  --  7.4 4.0  HGB 11.9*  --  11.9* 11.1*  PLT 127*  --  151 156  CREATININE 1.09 2.03* 2.05* 2.49*    Estimated Creatinine Clearance: 53.6 mL/min (by C-G formula based on Cr of 2.49).  Assessment: 64 yo male with symptoms of fever, cough, SOB. WBCs normal; elevated D-dimer. Ct shows evidence of multifocal pneumonia.  Legionella positive. Discussed narrowing antibiotics- wants to continue Zosyn, Zyvox, and Levaquin at this time. SCr doubled and little UOP. Patient initiated on CRRT.     Vanc 10/22>10/24 Zosyn 10/22>> Zyvox 10/24 >> Levaquin 10/25 >>   10/22 BCx ngtd 10/22 Urine: 10/22 Trach Asp: 10/22 RVP: negative 10/22 Legionella positive 10/22 Strep pneumo neg 10/22 Flu negative  Goal of Therapy:  Eradicate infection  Plan:  Adjust Levaquin to 500mg  IV q24h (dosing ok for CRRT per Lexi-Comp; differs from protocol due to severity of infection)  Adjust Zosyn to 2.25g IV q6h.    Link SnufferJessica Reshard Guillet, PharmD, BCPS Clinical Pharmacist [phone]  11/25/2014,12:40 PM

## 2014-11-25 NOTE — Procedures (Signed)
Central Venous Catheter Insertion Procedure Note Cannon KettleRobert F Cabell [id number] 12/22/1950  Procedure: Insertion of Central Venous Catheter Indications: Dialysis access  Procedure Details Consent: Risks of procedure as well as the alternatives and risks of each were explained to the (patient/caregiver).  Consent for procedure obtained. Time Out: Verified patient identification, verified procedure, site/side was marked, verified correct patient position, special equipment/implants available, medications/allergies/relevent history reviewed, required imaging and test results available.  Performed  Maximum sterile technique was used including antiseptics, cap, gloves, gown, hand hygiene, mask and sheet. Skin prep: Chlorhexidine; local anesthetic administered A antimicrobial bonded/coated triple lumen catheter was placed in the right femoral vein due to patient being a dialysis patient using the Seldinger technique.  Evaluation Blood flow good Complications: No apparent complications Patient did tolerate procedure well.  U/S used in placement.  YACOUB,WESAM 11/25/2014, 2:08 PM

## 2014-11-25 NOTE — Progress Notes (Signed)
Increased PEEP to +22 to meet SpO2 goal of >88% per ARDS protocol. Patient is currently at 89% on 100% FiO2 and +22 peep. RT will continue to monitor.

## 2014-11-25 NOTE — Progress Notes (Signed)
PULMONARY / CRITICAL CARE MEDICINE   Name: Brandon Villarreal MRN: [id number] DOB: 11-Jun-1950    ADMISSION DATE:  11/02/2014 CONSULTATION DATE:  11/03/2014  REFERRING MD :  Dr Jerilee Hoh / THR _0   CHIEF COMPLAINT:  Acute Respiratory Failure   STUDIES:  10/22 CTA Chest >> neg for PE, diffuse bilateral central airspace opacification R>L with air bronchograms, diffuse coronary calcifications, healing minimally displaced fracture of R lateral 6th rib 10/22 ECHO >> EF 45-50%, mild diffuse hypokinesis 10/22 Admit with diffuse bilateral airspace disease. CT negative for PE. ARDS PROTOCOL with NIMBEX - severe ARDS  SIGNIFICANT EVENTS: 10/22 - Intubated, admitted 10/24 - Bronch performed, no pus seen, started on solumedrol  SUBJECTIVE:  Intubated, sedated.  VITAL SIGNS: Temp:  [97.5 F (36.4 C)-100.6 F (38.1 C)] 97.5 F (36.4 C) (10/25 0744) Pulse Rate:  [35-117] 73 (10/25 0700) Resp:  [0-35] 35 (10/25 0700) BP: (86-156)/(49-63) 95/52 mmHg (10/25 0700) SpO2:  [87 %-98 %] 90 % (10/25 0700) Arterial Line BP: (92-197)/(42-64) 121/51 mmHg (10/25 0700) FiO2 (%):  [90 %-100 %] 100 % (10/25 0731) Weight:  [408 lb 1.1 oz (185.1 kg)] 408 lb 1.1 oz (185.1 kg) (10/25 0355) HEMODYNAMICS: CVP:  [10 mmHg-16 mmHg] 16 mmHg VENTILATOR SETTINGS: Vent Mode:  [-] PRVC FiO2 (%):  [90 %-100 %] 100 % Set Rate:  [35 bmp] 35 bmp Vt Set:  [510 mL] 510 mL PEEP:  [18 cmH20-22 cmH20] 22 cmH20 Plateau Pressure:  [31 cmH20-37 cmH20] 37 cmH20 INTAKE / OUTPUT:  Intake/Output Summary (Last 24 hours) at 11/25/14 0748 Last data filed at 11/25/14 0600  Gross per 24 hour  Intake 5500.88 ml  Output   1165 ml  Net 4335.88 ml    PHYSICAL EXAMINATION: General:  64yo male, intubated Neuro:  Sedated, paralyzed HEENT:  OETT, MMM Cardiovascular:  S1s2, RRR Lungs:  Diffuse coarse breath sounds Abdomen:  Obese, +BS, S, ND, NT Musculoskeletal:  No acute deformities Skin:  Dry, 2-3+ pitting edema in BLE, R great  toe with small annular open area without drainage   LABS:  CBC  Recent Labs Lab 11/23/14 0532 11/24/14 0959 11/25/14 0419  WBC 7.0 7.4 4.0  HGB 11.9* 11.9* 11.1*  HCT 35.9* 34.8* 34.4*  PLT 127* 151 156   Coag's No results for input(s): APTT, INR in the last 168 hours. BMET  Recent Labs Lab 11/24/14 0405 11/24/14 0959 11/25/14 0419  NA 137 137 134*  K 5.0 4.6 5.2*  CL 106 103 102  CO2 _1 BUN 38* 39* 55*  CREATININE 2.03* 2.05* 2.49*  GLUCOSE 263* 246* 349*   Electrolytes  Recent Labs Lab 11/23/14 0532 11/24/14 0405 11/24/14 0959 11/25/14 0419  CALCIUM 7.5* 7.4* 7.6* 7.2*  MG 1.4* 2.1  --  2.1  PHOS 3.4 3.6  --  3.6   Sepsis Markers  Recent Labs Lab 11/05/2014 0226 11/23/2014 0518 11/13/2014 2143 11/28/2014 2151 11/23/14 0532 11/24/14 0405  LATICACIDVEN 2.16* 2.18*  --  0.9  --   --   PROCALCITON  --   --  1.80  --  1.82 2.98   ABG  Recent Labs Lab 11/23/14 2305 11/24/14 1945 11/25/14 0415  PHART 7.251* 7.278* 7.241*  PCO2ART 51.0* 50.1* 55.5*  PO2ART 134* 79.2* 68.8*   Liver Enzymes  Recent Labs Lab 11/24/2014 0215 11/23/14 0532 11/24/14 0959  AST 72* 44* 85*  ALT 70* 53 60  ALKPHOS 123 88 97  BILITOT 2.3* 2.2* 2.3*  ALBUMIN 2.9* 1.9* 1.8*  Cardiac Enzymes  Recent Labs Lab 11/30/2014 2143 11/23/14 0532 11/23/14 1145  TROPONINI 0.10* 0.05* 0.03   Glucose  Recent Labs Lab 11/24/14 0404 11/24/14 0758 11/24/14 1125 11/24/14 1527 11/24/14 1959 11/25/14 0403  GLUCAP 244* 221* 225* 212* 309* 315*    Imaging Dg Chest Port 1 View  11/25/2014  CLINICAL DATA:  Hypoxia EXAM: PORTABLE CHEST 1 VIEW COMPARISON:  November 24, 2014 FINDINGS: Endotracheal tube tip is 5.1 cm above the carina. Nasogastric tube tip and side port are below the diaphragm. No pneumothorax. Widespread airspace consolidation bilaterally is stable. No new opacity. Heart is prominent but stable. Pulmonary vascularity is within normal limits. IMPRESSION: Tube  positions as described without pneumothorax. Stable widespread airspace consolidation bilaterally. No change in cardiac silhouette. Electronically Signed   By: Lowella Grip III M.D.   On: 11/25/2014 07:23   Dg Chest Port 1 View  11/24/2014  CLINICAL DATA:  Hypoxia with central catheter placement EXAM: PORTABLE CHEST 1 VIEW COMPARISON:  Study obtained earlier in the day FINDINGS: Central catheter tip is in the superior vena cava near the cavoatrial junction. Endotracheal tube tip is 5.2 cm above the carina. Nasogastric tube tip and side port are below the diaphragm. No pneumothorax. Widespread airspace consolidation persists without change. Heart is upper normal in size with pulmonary vascularity within normal limits. IMPRESSION: Central catheter tip is in the superior vena cava near the cavoatrial junction. Otherwise no change compared to earlier in the day. No pneumothorax apparent. Electronically Signed   By: Lowella Grip III M.D.   On: 11/24/2014 11:36   ASSESSMENT / PLAN:   PULMONARY OETT 10/22 >>  A: Acute Hypoxic Respiratory Failure - secondary to diffuse R>L bilateral infiltrates  Multi-focal PNA / CAP -  ARDS - severe Pa/FiO2 ratio 60 R/o autoimmune disorder  P:  ARDS protocol with Nimbex since 11/07/2014 p.m. Wean PEEP/FiO2 for sats > 92% CXR in AM ABG in AM ANCA pending CRP 35.2, ESR 35 Solumedrol 249m q6hrs 10/24 >>>  CARDIOVASCULAR CVL L FEM 10/22 >>  A:  Soft/Normal BP - suspect sedation related. On Lasix at home  Hx HTN/HLD Venous Stasis  Echo with 45-50% EF  P:  Daily diuresis. Daily ASA. Hold benazepril with AKI risk. Hold norvasc, cardura, fenofibrate. May need levophed if MAP <65.  RENAL A:  AKI P:  Fluids to KVO. Trend BMP / UOP. Replace electrolytes as indicated. Renal consult called, will place trialysis catheter for CVVH.  GASTROINTESTINAL A:  Vent Associated Dysphagia - intubated for respiratory failure  P:  TF  per nutrition. PPI.  HEMATOLOGIC A:  No acute process  P:  Heparin for DVT prophylaxis. Transfuse per ICU guidelines.  INFECTIOUS A:  Fever R/O Flu Sepsis  Multi-Focal Airspace Disease - concerning for CAP  P:  BCx2 10/22 >>  UC 10/22 >>  Sputum 10/22 >>  Influenza 10/22 >> neg  Influenza tracheal aspirate 10/22 >>  Respiratory virus panel tracheal aspirate October 22>> Neg Legionella 10/22 >>>Pos RVP 10/24 >>> negative Tracheal culture 10/24 >>>  Vanc 10/22 >>> 10/24 Linezolid 10/24 >>> Zosyn 10/22 >>> Levaquin 10/24 >>>  ENDOCRINE A:  DM  P:  SSI  Increase Lantus 75 units BID (reduced from home dose of 100 BID) Hold home glyburide-metformin   NEUROLOGIC A:  Sedation needs  P:  RASS goal: -4/BIS goal < 50 with fent/versed. Neuromuscular blockade since 11/13/2014 pm. ARDS protocol. Fentanyl gtt / Versed gtts.  FAMILY  - Updates: at bedside 10/25, after discussion  will make patient LCB with no CPR, cardioversion, trach, PEG and short term intubation only.   - Inter-disciplinary family meet or Palliative Care meeting due by: 10/30  Algis Greenhouse. Jerline Pain, [place] Resident PGY-2 11/25/2014 7:48 AM   Attending Note:  64 year old male with PMH of diabetes and HTN presenting with bilateral pulmonary infiltrate and ARDS. Diffuse coarse BS diffusely. Now in renal failure and failure to improve from a respiratory standpoint with urine legionella positive. Will call renal for CRRT, need major volume negative, add levo for legionella, start TF, continue lasix drip until seen by renal.  KVO all IVF.  Family updated and above note edited in full.  Will make LCB as above.   The patient is critically ill with multiple organ systems failure and requires high complexity decision making for assessment and support, frequent evaluation and titration of therapies, application of advanced monitoring technologies and extensive  interpretation of multiple databases.   Critical Care Time devoted to patient care services described in this note is 35 Minutes. This time reflects time of care of this signee Dr Jennet Maduro. This critical care time does not reflect procedure time, or teaching time or supervisory time of PA/NP/Med student/Med Resident etc but could involve care discussion time.  Rush Farmer, M.D. [facility] Pulmonary/Critical Care Medicine. Pager: [phone]. After hours pager: [phone].

## 2014-11-25 NOTE — Procedures (Deleted)
Hemodialysis Insertion Procedure Note Brandon KettleRobert F Villarreal [id number] 12/14/1950  Procedure: Insertion of Hemodialysis Catheter Type: 3 port  Indications: Hemodialysis   Procedure Details Consent: Risks of procedure as well as the alternatives and risks of each were explained to the (patient/caregiver).  Consent for procedure obtained. Time Out: Verified patient identification, verified procedure, site/side was marked, verified correct patient position, special equipment/implants available, medications/allergies/relevent history reviewed, required imaging and test results available.  Performed  Maximum sterile technique was used including antiseptics, cap, gloves, gown, hand hygiene, mask and sheet. Skin prep: Chlorhexidine; local anesthetic administered A antimicrobial bonded/coated triple lumen catheter was placed in the right femoral vein due to patient being a dialysis patient using the Seldinger technique. Ultrasound guidance used.Yes.   Catheter placed to 20 cm. Blood aspirated via all 3 ports and then flushed x 3. Line sutured x 2 and dressing applied.  Evaluation Blood flow good Complications: No apparent complications Patient did tolerate procedure well. Chest X-ray ordered to verify placement.  CXR: not needed in femoral line.  Brandon CanalesSteve Koa Villarreal ACNP Brandon PollackLe Villarreal PCCM Pager [phone] till 3 pm If no answer page [phone] 11/25/2014, 11:36 AM

## 2014-11-25 NOTE — Progress Notes (Addendum)
Inpatient Diabetes Program Recommendations  AACE/ADA: New Consensus Statement on Inpatient Glycemic Control (2015)  Target Ranges:  Prepandial:   less than 140 mg/dL      Peak postprandial:   less than 180 mg/dL (1-2 hours)      Critically ill patients:  140 - 180 mg/dL   Results for Cannon KettleSLEY, Terren F (MRN [id number]) as of 11/25/2014 07:37  Ref. Range 11/24/2014 00:14 11/24/2014 04:04 11/24/2014 07:58 11/24/2014 11:25 11/24/2014 15:27 11/24/2014 19:59  Glucose-Capillary Latest Ref Range: 65-99 mg/dL 469215 (H) 629244 (H) 528221 (H) 225 (H) 212 (H) 309 (H)    Results for Cannon KettleSLEY, Bentzion F (MRN [id number]) as of 11/25/2014 07:37  Ref. Range 11/25/2014 04:03  Glucose-Capillary Latest Ref Range: 65-99 mg/dL 272315 (H)     Admit with: Pneumonia/ ARDS Protocol initiated  History: DM, HTN  Home DM Meds: Lantus 60 units bid  Glucovance (Glyburide/ Metformin) 5/500- 2 tablets bid  Current Insulin Orders: Lantus 60 units bid      Novolog Moderate SSI (0-15 units) Q4 hours      -Note patient is currently intubated, sedated, paralyzed.   -Getting Oxepa tube feeds at 40cc/hour.  -Glucose levels 200-300s mg/dl since yesterday.  -Note patient received 3 doses of IV Solumedrol yesterday (250 mg each).  Note Solumedrol d/c'd as of today.      MD- Please consider the following in-hospital insulin adjustments:   Start ICU Glycemic Control Protocol- Phase 1 (IV insulin drip)   Or   Please start Novolog 4 units Q4 hours (tube feed coverage) and Increase Novolog SSI to Resistant scale (0-20 units) Q4 hours      --Will follow patient during hospitalization--  Ambrose FinlandJeannine Johnston Adara Kittle RN, MSN, CDE Diabetes Coordinator Inpatient Glycemic Control Team Team Pager: [phone] (8a-5p)

## 2014-11-26 ENCOUNTER — Inpatient Hospital Stay (HOSPITAL_COMMUNITY): Payer: BLUE CROSS/BLUE SHIELD

## 2014-11-26 LAB — POCT ACTIVATED CLOTTING TIME
ACTIVATED CLOTTING TIME: 153 s
ACTIVATED CLOTTING TIME: 153 s
ACTIVATED CLOTTING TIME: 153 s
ACTIVATED CLOTTING TIME: 153 s
ACTIVATED CLOTTING TIME: 153 s
ACTIVATED CLOTTING TIME: 153 s
ACTIVATED CLOTTING TIME: 165 s
ACTIVATED CLOTTING TIME: 171 s
ACTIVATED CLOTTING TIME: 171 s
ACTIVATED CLOTTING TIME: 202 s
Activated Clotting Time: 122 seconds
Activated Clotting Time: 141 seconds
Activated Clotting Time: 141 seconds
Activated Clotting Time: 153 seconds
Activated Clotting Time: 147 seconds
Activated Clotting Time: 159 seconds
Activated Clotting Time: 165 seconds
Activated Clotting Time: 165 seconds
Activated Clotting Time: 177 seconds
Activated Clotting Time: 178 seconds
Activated Clotting Time: 196 seconds
Activated Clotting Time: 183 seconds
Activated Clotting Time: 190 seconds
Activated Clotting Time: 196 seconds
Activated Clotting Time: 190 s

## 2014-11-26 LAB — BLOOD GAS, ARTERIAL
Acid-base deficit: 2.8 mmol/L — ABNORMAL HIGH (ref 0.0–2.0)
Bicarbonate: 23.1 mEq/L (ref 20.0–24.0)
DRAWN BY: 437071
FIO2: 0.9
O2 Saturation: 96.1 %
PEEP: 18 cmH2O
PH ART: 7.276 — AB (ref 7.350–7.450)
Patient temperature: 97.9
RATE: 35 resp/min
TCO2: 24.7 mmol/L (ref 0–100)
VT: 510 mL
pCO2 arterial: 50.9 mmHg — ABNORMAL HIGH (ref 35.0–45.0)
pO2, Arterial: 83 mmHg (ref 80.0–100.0)

## 2014-11-26 LAB — POCT I-STAT 3, ART BLOOD GAS (G3+)
ACID-BASE DEFICIT: 4 mmol/L — AB (ref 0.0–2.0)
Bicarbonate: 24.2 mEq/L — ABNORMAL HIGH (ref 20.0–24.0)
O2 Saturation: 96 %
TCO2: 26 mmol/L (ref 0–100)
pCO2 arterial: 53.7 mmHg — ABNORMAL HIGH (ref 35.0–45.0)
pH, Arterial: 7.259 — ABNORMAL LOW (ref 7.350–7.450)
pO2, Arterial: 90 mmHg (ref 80.0–100.0)

## 2014-11-26 LAB — RENAL FUNCTION PANEL
ALBUMIN: 1.6 g/dL — AB (ref 3.5–5.0)
ANION GAP: 10 (ref 5–15)
ANION GAP: 11 (ref 5–15)
Albumin: 1.7 g/dL — ABNORMAL LOW (ref 3.5–5.0)
BUN: 75 mg/dL — ABNORMAL HIGH (ref 6–20)
BUN: 82 mg/dL — ABNORMAL HIGH (ref 6–20)
CALCIUM: 7.2 mg/dL — AB (ref 8.9–10.3)
CHLORIDE: 102 mmol/L (ref 101–111)
CHLORIDE: 107 mmol/L (ref 101–111)
CO2: 24 mmol/L (ref 22–32)
CO2: 23 mmol/L (ref 22–32)
Calcium: 7.5 mg/dL — ABNORMAL LOW (ref 8.9–10.3)
Creatinine, Ser: 2.43 mg/dL — ABNORMAL HIGH (ref 0.61–1.24)
Creatinine, Ser: 2.39 mg/dL — ABNORMAL HIGH (ref 0.61–1.24)
GFR, EST AFRICAN AMERICAN: 31 mL/min — AB (ref >60)
GFR, EST AFRICAN AMERICAN: 32 mL/min — AB (ref >60)
GFR, EST NON AFRICAN AMERICAN: 27 mL/min — AB (ref >60)
GFR, EST NON AFRICAN AMERICAN: 27 mL/min — AB (ref >60)
Glucose, Bld: 187 mg/dL — ABNORMAL HIGH (ref 65–99)
Glucose, Bld: 157 mg/dL — ABNORMAL HIGH (ref 65–99)
POTASSIUM: 4.7 mmol/L (ref 3.5–5.1)
POTASSIUM: 4.9 mmol/L (ref 3.5–5.1)
Phosphorus: 3.9 mg/dL (ref 2.5–4.6)
Phosphorus: 3.9 mg/dL (ref 2.5–4.6)
Sodium: 136 mmol/L (ref 135–145)
Sodium: 141 mmol/L (ref 135–145)

## 2014-11-26 LAB — APTT: aPTT: 48 seconds — ABNORMAL HIGH (ref 24–37)

## 2014-11-26 LAB — CBC
HCT: 35.7 % — ABNORMAL LOW (ref 39.0–52.0)
HEMOGLOBIN: 11.3 g/dL — AB (ref 13.0–17.0)
MCH: 27.4 pg (ref 26.0–34.0)
MCHC: 31.7 g/dL (ref 30.0–36.0)
MCV: 86.7 fL (ref 78.0–100.0)
PLATELETS: 215 10*3/uL (ref 150–400)
RBC: 4.12 MIL/uL — AB (ref 4.22–5.81)
RDW: 16.3 % — ABNORMAL HIGH (ref 11.5–15.5)
WBC: 8.4 10*3/uL (ref 4.0–10.5)

## 2014-11-26 LAB — GLUCOSE, CAPILLARY
GLUCOSE-CAPILLARY: 181 mg/dL — AB (ref 65–99)
GLUCOSE-CAPILLARY: 194 mg/dL — AB (ref 65–99)
GLUCOSE-CAPILLARY: 201 mg/dL — AB (ref 65–99)
GLUCOSE-CAPILLARY: 224 mg/dL — AB (ref 65–99)
GLUCOSE-CAPILLARY: 232 mg/dL — AB (ref 65–99)
Glucose-Capillary: 163 mg/dL — ABNORMAL HIGH (ref 65–99)
Glucose-Capillary: 163 mg/dL — ABNORMAL HIGH (ref 65–99)
Glucose-Capillary: 138 mg/dL — ABNORMAL HIGH (ref 65–99)
Glucose-Capillary: 163 mg/dL — ABNORMAL HIGH (ref 65–99)
Glucose-Capillary: 179 mg/dL — ABNORMAL HIGH (ref 65–99)
Glucose-Capillary: 176 mg/dL — ABNORMAL HIGH (ref 65–99)
Glucose-Capillary: 175 mg/dL — ABNORMAL HIGH (ref 65–99)

## 2014-11-26 LAB — MAGNESIUM: MAGNESIUM: 2.4 mg/dL (ref 1.7–2.4)

## 2014-11-26 MED ORDER — INSULIN ASPART 100 UNIT/ML ~~LOC~~ SOLN
1.0000 [IU] | SUBCUTANEOUS | Status: DC
  Administered 2014-11-26: 2 [IU] via SUBCUTANEOUS
  Administered 2014-11-27: 3 [IU] via SUBCUTANEOUS

## 2014-11-26 MED ORDER — INSULIN GLARGINE 100 UNIT/ML ~~LOC~~ SOLN
30.0000 [IU] | SUBCUTANEOUS | Status: DC
  Administered 2014-11-26: 30 [IU] via SUBCUTANEOUS
  Filled 2014-11-26 (×2): qty 0.3

## 2014-11-26 MED ORDER — INSULIN ASPART 100 UNIT/ML ~~LOC~~ SOLN
5.0000 [IU] | SUBCUTANEOUS | Status: DC
  Administered 2014-11-26 – 2014-11-27 (×2): 5 [IU] via SUBCUTANEOUS

## 2014-11-26 MED ORDER — DEXTROSE 10 % IV SOLN: INTRAVENOUS | Status: DC | PRN

## 2014-11-26 NOTE — Progress Notes (Signed)
Patient desaturated to 84-88% during turning by RN staff.  Recruitment procedure performed per ARDS protocol without incident.  Bilateral LS unchanged during procedure.  Increase in SpO2 to 91% post procedure.

## 2014-11-26 NOTE — Progress Notes (Signed)
PULMONARY / CRITICAL CARE MEDICINE   Name: Brandon Villarreal MRN: [id number] DOB: 01-29-1951    ADMISSION DATE:  11/01/2014 CONSULTATION DATE:  11/01/2014  REFERRING MD :  Dr Jerilee Hoh / THR @APH   CHIEF COMPLAINT:  Acute Respiratory Failure   STUDIES:  10/22 CTA Chest >> neg for PE, diffuse bilateral central airspace opacification R>L with air bronchograms, diffuse coronary calcifications, healing minimally displaced fracture of R lateral 6th rib 10/22 ECHO >> EF 45-50%, mild diffuse hypokinesis 10/22 Admit with diffuse bilateral airspace disease. CT negative for PE. ARDS PROTOCOL with NIMBEX - severe ARDS  SIGNIFICANT EVENTS: 10/22 - Intubated, admitted 10/24 - Bronch performed, no pus seen, started on solumedrol  SUBJECTIVE:  Intubated, sedated, no events overnight.  FiO2 decreasing.  VITAL SIGNS: Temp:  [93.8 F (34.3 C)-97.9 F (36.6 C)] 97.4 F (36.3 C) (10/26 0844) Pulse Rate:  [51-94] 73 (10/26 1100) Resp:  [31-35] 35 (10/26 1100) BP: (82-130)/(43-78) 91/50 mmHg (10/26 1100) SpO2:  [90 %-100 %] 97 % (10/26 1100) Arterial Line BP: (103-148)/(46-60) 117/54 mmHg (10/26 1100) FiO2 (%):  [90 %-100 %] 90 % (10/26 0852) Weight:  [175.542 kg (387 lb)-176.449 kg (389 lb)] 175.542 kg (387 lb) (10/26 0500)  HEMODYNAMICS: CVP:  [8 mmHg-12 mmHg] 12 mmHg  VENTILATOR SETTINGS: Vent Mode:  [-] PRVC FiO2 (%):  [90 %-100 %] 90 % Set Rate:  [35 bmp] 35 bmp Vt Set:  [510 mL] 510 mL PEEP:  [14 cmH20-22 cmH20] 18 cmH20 Plateau Pressure:  [25 WUJ81-19 cmH20] 30 cmH20  INTAKE / OUTPUT:  Intake/Output Summary (Last 24 hours) at 11/26/14 1122 Last data filed at 11/26/14 1100  Gross per 24 hour  Intake 3783.12 ml  Output   6546 ml  Net -2762.88 ml   PHYSICAL EXAMINATION: General:  64yo male, intubated, sedated and paralyzed. Neuro:  Sedated, paralyzed HEENT:  OETT, MMM Cardiovascular:  S1s2, RRR Lungs:  Diffuse coarse breath sounds Abdomen:  Obese, +BS, S, ND,  NT Musculoskeletal:  No acute deformities Skin:  Dry, 2-3+ pitting edema in BLE, R great toe with small annular open area without drainage   LABS:  CBC  Recent Labs Lab 11/24/14 0959 11/25/14 0419 11/26/14 0544  WBC 7.4 4.0 8.4  HGB 11.9* 11.1* 11.3*  HCT 34.8* 34.4* 35.7*  PLT 151 156 215   Coag's  Recent Labs Lab 11/26/14 0544  APTT 48*   BMET  Recent Labs Lab 11/25/14 0419 11/25/14 1610 11/26/14 0544  NA 134* 140 136  K 5.2* 5.2* 4.7  CL 102 108 102  CO2 26 24 24   BUN 55* 72* 75*  CREATININE 2.49* 2.56* 2.43*  GLUCOSE 349* 364* 187*   Electrolytes  Recent Labs Lab 11/24/14 0405  11/25/14 0419 11/25/14 1610 11/26/14 0544  CALCIUM 7.4*  < > 7.2* 7.0* 7.2*  MG 2.1  --  2.1  --  2.4  PHOS 3.6  --  3.6 4.4 3.9  < > = values in this interval not displayed. Sepsis Markers  Recent Labs Lab 11/25/2014 0226 11/06/2014 0518 11/21/2014 2143 11/11/2014 2151 11/23/14 0532 11/24/14 0405  LATICACIDVEN 2.16* 2.18*  --  0.9  --   --   PROCALCITON  --   --  1.80  --  1.82 2.98   ABG  Recent Labs Lab 11/24/14 1945 11/25/14 0415 11/26/14 0453  PHART 7.278* 7.241* 7.276*  PCO2ART 50.1* 55.5* 50.9*  PO2ART 79.2* 68.8* 83.0   Liver Enzymes  Recent Labs Lab 11/16/2014 0215 11/23/14 0532 11/24/14 0959 11/25/14  1610 11/26/14 0544  AST 72* 44* 85*  --   --   ALT 70* 53 60  --   --   ALKPHOS 123 88 97  --   --   BILITOT 2.3* 2.2* 2.3*  --   --   ALBUMIN 2.9* 1.9* 1.8* 1.5* 1.6*   Cardiac Enzymes  Recent Labs Lab 11/01/2014 2143 11/23/14 0532 11/23/14 1145  TROPONINI 0.10* 0.05* 0.03   Glucose  Recent Labs Lab 11/26/14 0109 11/26/14 0213 11/26/14 0313 11/26/14 0409 11/26/14 0518 11/26/14 0616  GLUCAP 224* 201* 194* 181* 163* 163*   Imaging Dg Chest Port 1 View  11/26/2014  CLINICAL DATA:  Acute respiratory failure, history of community-acquired pneumonia, sepsis, ARDS, acute renal failure. EXAM: PORTABLE CHEST 1 VIEW COMPARISON:  Portable  chest x-ray of November 25, 2014 FINDINGS: The lungs are reasonably well inflated. There are come persistent bilateral confluent alveolar airspace opacities. The left hemidiaphragm is less well demonstrated today. The cardiac silhouette is mildly enlarged. The pulmonary vascularity is indistinct. No pleural effusion or pneumothorax is observed. The endotracheal tube tip lies 5.6 cm above the carina. The esophagogastric tube tip projects below the inferior margin of the image. The right internal jugular venous catheter tip projects over the midportion of the SVC. IMPRESSION: Stable appearance of the chest with fluffy alveolar infiltrates consistent with ARDS -pneumonia. The support tubes are in reasonable position. Electronically Signed   By: David  Martinique M.D.   On: 11/26/2014 07:11   ASSESSMENT / PLAN:  PULMONARY OETT 10/22 >>  A: Acute Hypoxic Respiratory Failure - secondary to diffuse R>L bilateral infiltrates  Multi-focal PNA / CAP -  ARDS - severe Pa/FiO2 ratio 60 R/o autoimmune disorder  P:  ARDS protocol with Nimbex since 11/28/2014 p.m. Wean PEEP/FiO2 for sats > 92% CXR in AM ABG in AM ANCA pending CRP 35.2, ESR 35 Solumedrol 211m q6hrs 10/24 >>>10/27 then taper.  CARDIOVASCULAR CVL L FEM 10/22 >>  A:  Soft/Normal BP - suspect sedation related. On Lasix at home  Hx HTN/HLD Venous Stasis  Echo with 45-50% EF  P:  CRRT negative 200 ml/hr. Daily ASA. Hold benazepril with AKI risk. Hold norvasc, cardura, fenofibrate. May need levophed if MAP <65.  RENAL A:  AKI P:  Fluids to KVO. Trend BMP/UOP. Replace electrolytes as indicated. CVVH negative 200 ml/hr.  GASTROINTESTINAL A:  Vent Associated Dysphagia - intubated for respiratory failure  P:  TF per nutrition. PPI.  HEMATOLOGIC A:  No acute process  P:  Heparin for DVT prophylaxis. Transfuse per ICU guidelines.  INFECTIOUS A:  Fever R/O Flu Sepsis  Multi-Focal Airspace  Disease - concerning for CAP  P:  BCx2 10/22 >>  UC 10/22 >>  Sputum 10/22 >>  Influenza 10/22 >> neg  Influenza tracheal aspirate 10/22 >>  Respiratory virus panel tracheal aspirate October 22>> Neg Legionella 10/22 >>>Pos RVP 10/24 >>> negative Tracheal culture 10/24 >>>  Vanc 10/22 >>> 10/24 Linezolid 10/24 >>> Zosyn 10/22 >>> Levaquin 10/24 >>>  ENDOCRINE A:  DM  P:  SSI  Increased Lantus 75 units BID (reduced from home dose of 100 BID) Hold home glyburide-metformin   NEUROLOGIC A:  Sedation needs  P:  RASS goal: -4/BIS goal < 50 with fent/versed. Neuromuscular blockade since 11/15/2014 pm. ARDS protocol. Fentanyl gtt / Versed gtts.  FAMILY  - Updates: Wife and daughter updated bedside.  - Inter-disciplinary family meet or Palliative Care meeting due by: 10/30  The patient is critically ill with multiple  organ systems failure and requires high complexity decision making for assessment and support, frequent evaluation and titration of therapies, application of advanced monitoring technologies and extensive interpretation of multiple databases.   Critical Care Time devoted to patient care services described in this note is 35 Minutes. This time reflects time of care of this signee Dr Jennet Maduro. This critical care time does not reflect procedure time, or teaching time or supervisory time of PA/NP/Med student/Med Resident etc but could involve care discussion time.  Rush Farmer, M.D. [facility] Pulmonary/Critical Care Medicine. Pager: [phone]. After hours pager: [phone].

## 2014-11-26 NOTE — Progress Notes (Signed)
CVVHD Management.  Not on pressors.  Pulling 200cc/hr though was cut off last night as he had some hypotension. Metabolically stable.  CVP 11.  Cont to pull 200cc/hr as tolerated.

## 2014-11-27 ENCOUNTER — Inpatient Hospital Stay (HOSPITAL_COMMUNITY): Payer: BLUE CROSS/BLUE SHIELD

## 2014-11-27 LAB — BLOOD GAS, ARTERIAL
ACID-BASE DEFICIT: 4.9 mmol/L — AB (ref 0.0–2.0)
BICARBONATE: 20.9 meq/L (ref 20.0–24.0)
Drawn by: 44898
FIO2: 0.8
LHR: 35 {breaths}/min
O2 SAT: 96 %
PATIENT TEMPERATURE: 98.6
PCO2 ART: 46.8 mmHg — AB (ref 35.0–45.0)
PEEP/CPAP: 14 cmH2O
PO2 ART: 86 mmHg (ref 80.0–100.0)
TCO2: 22.3 mmol/L (ref 0–100)
VT: 510 mL
pH, Arterial: 7.272 — ABNORMAL LOW (ref 7.350–7.450)

## 2014-11-27 LAB — CULTURE, BLOOD (ROUTINE X 2)
CULTURE: NO GROWTH
Culture: NO GROWTH

## 2014-11-27 LAB — RENAL FUNCTION PANEL
ANION GAP: 8 (ref 5–15)
Albumin: 1.7 g/dL — ABNORMAL LOW (ref 3.5–5.0)
Albumin: 1.7 g/dL — ABNORMAL LOW (ref 3.5–5.0)
Anion gap: 9 (ref 5–15)
BUN: 94 mg/dL — ABNORMAL HIGH (ref 6–20)
BUN: 97 mg/dL — AB (ref 6–20)
CALCIUM: 7.3 mg/dL — AB (ref 8.9–10.3)
CHLORIDE: 101 mmol/L (ref 101–111)
CHLORIDE: 108 mmol/L (ref 101–111)
CO2: 26 mmol/L (ref 22–32)
CO2: 25 mmol/L (ref 22–32)
Calcium: 7.5 mg/dL — ABNORMAL LOW (ref 8.9–10.3)
Creatinine, Ser: 2.21 mg/dL — ABNORMAL HIGH (ref 0.61–1.24)
Creatinine, Ser: 2.18 mg/dL — ABNORMAL HIGH (ref 0.61–1.24)
GFR calc Af Amer: 35 mL/min — ABNORMAL LOW (ref >60)
GFR calc non Af Amer: 30 mL/min — ABNORMAL LOW (ref >60)
GFR, EST AFRICAN AMERICAN: 35 mL/min — AB (ref >60)
GFR, EST NON AFRICAN AMERICAN: 30 mL/min — AB (ref >60)
GLUCOSE: 289 mg/dL — AB (ref 65–99)
Glucose, Bld: 262 mg/dL — ABNORMAL HIGH (ref 65–99)
POTASSIUM: 4.9 mmol/L (ref 3.5–5.1)
Phosphorus: 4.5 mg/dL (ref 2.5–4.6)
Phosphorus: 4.5 mg/dL (ref 2.5–4.6)
Potassium: 4.9 mmol/L (ref 3.5–5.1)
SODIUM: 135 mmol/L (ref 135–145)
Sodium: 142 mmol/L (ref 135–145)

## 2014-11-27 LAB — CULTURE, BAL-QUANTITATIVE W GRAM STAIN

## 2014-11-27 LAB — CULTURE, BAL-QUANTITATIVE

## 2014-11-27 LAB — POCT ACTIVATED CLOTTING TIME
ACTIVATED CLOTTING TIME: 177 s
ACTIVATED CLOTTING TIME: 183 s
ACTIVATED CLOTTING TIME: 202 s
ACTIVATED CLOTTING TIME: 202 s
Activated Clotting Time: 122 seconds
Activated Clotting Time: 183 seconds
Activated Clotting Time: 196 seconds
Activated Clotting Time: 202 seconds
Activated Clotting Time: 202 seconds
Activated Clotting Time: 190 seconds

## 2014-11-27 LAB — CBC
HEMATOCRIT: 38.4 % — AB (ref 39.0–52.0)
HEMOGLOBIN: 11.9 g/dL — AB (ref 13.0–17.0)
MCH: 27 pg (ref 26.0–34.0)
MCHC: 31 g/dL (ref 30.0–36.0)
MCV: 87.3 fL (ref 78.0–100.0)
Platelets: 209 10*3/uL (ref 150–400)
RBC: 4.4 MIL/uL (ref 4.22–5.81)
RDW: 16.5 % — ABNORMAL HIGH (ref 11.5–15.5)
WBC: 11.5 10*3/uL — AB (ref 4.0–10.5)

## 2014-11-27 LAB — APTT: APTT: 171 s — AB (ref 24–37)

## 2014-11-27 LAB — PHOSPHORUS: Phosphorus: 4.5 mg/dL (ref 2.5–4.6)

## 2014-11-27 LAB — GLUCOSE, CAPILLARY
GLUCOSE-CAPILLARY: 165 mg/dL — AB (ref 65–99)
GLUCOSE-CAPILLARY: 219 mg/dL — AB (ref 65–99)
Glucose-Capillary: 284 mg/dL — ABNORMAL HIGH (ref 65–99)
Glucose-Capillary: 244 mg/dL — ABNORMAL HIGH (ref 65–99)

## 2014-11-27 LAB — MAGNESIUM: Magnesium: 2.7 mg/dL — ABNORMAL HIGH (ref 1.7–2.4)

## 2014-11-27 MED ORDER — INSULIN GLARGINE 100 UNIT/ML ~~LOC~~ SOLN
20.0000 [IU] | Freq: Two times a day (BID) | SUBCUTANEOUS | Status: DC
  Administered 2014-11-27 – 2014-11-28 (×3): 20 [IU] via SUBCUTANEOUS
  Filled 2014-11-27 (×5): qty 0.2

## 2014-11-27 MED ORDER — DEXTROSE 5 % IV SOLN
160.0000 mg | Freq: Three times a day (TID) | INTRAVENOUS | Status: DC
  Administered 2014-11-27 – 2014-11-28 (×4): 160 mg via INTRAVENOUS
  Filled 2014-11-27 (×6): qty 16

## 2014-11-27 MED ORDER — INSULIN ASPART 100 UNIT/ML ~~LOC~~ SOLN
0.0000 [IU] | SUBCUTANEOUS | Status: DC
  Administered 2014-11-27: 7 [IU] via SUBCUTANEOUS
  Administered 2014-11-27 (×2): 11 [IU] via SUBCUTANEOUS
  Administered 2014-11-27: 7 [IU] via SUBCUTANEOUS
  Administered 2014-11-27 – 2014-11-28 (×2): 11 [IU] via SUBCUTANEOUS
  Administered 2014-11-28: 7 [IU] via SUBCUTANEOUS
  Administered 2014-11-28: 15 [IU] via SUBCUTANEOUS
  Administered 2014-11-28: 4 [IU] via SUBCUTANEOUS
  Administered 2014-11-28 (×2): 15 [IU] via SUBCUTANEOUS
  Administered 2014-11-29 (×2): 11 [IU] via SUBCUTANEOUS
  Administered 2014-11-29: 15 [IU] via SUBCUTANEOUS

## 2014-11-27 NOTE — Progress Notes (Signed)
CVVHD Management.  Metabolically stable.  Starting to make a little urine, will try IV lasix.  Decrease fluid to 100-150cc/hr.  On no pressors.  Filters working well.

## 2014-11-27 NOTE — Progress Notes (Signed)
Weaned fio2 to 70% and peep to 12 per MD order.  Pt tol well so far, RN aware.

## 2014-11-27 NOTE — Progress Notes (Signed)
Attempting to wean fio2 again s/p pt desat earlier this afternoon with turning and laying flat.  Fio2 weaned to 70%, sat 94%.  RN and MD aware.

## 2014-11-27 NOTE — Progress Notes (Signed)
eLink Physician-Brief Progress Note Patient Name: Brandon KettleRobert F Delarosa DOB: 03/14/1950 MRN: [id number]   Date of Service  11/27/2014  HPI/Events of Note    eICU Interventions  Insulin regimen adjusted     Intervention Category Major Interventions: Hyperglycemia - active titration of insulin therapy  Billy FischerDavid Ger Ringenberg 11/27/2014, 3:58 AM

## 2014-11-27 NOTE — Progress Notes (Signed)
After bath pt desat to 78% and BP low. Fi02 on vent is at 100%. Respiratory therapy called and talked to elink nurse. Will continue to monitor.

## 2014-11-27 NOTE — Progress Notes (Signed)
PULMONARY / CRITICAL CARE MEDICINE   Name: Brandon Villarreal MRN: [id number] DOB: 1950-03-02    ADMISSION DATE:  11/10/2014 CONSULTATION DATE:  11/16/2014  REFERRING MD :  Dr Jerilee Hoh / THR @[facility]   CHIEF COMPLAINT:  Acute Respiratory Failure   STUDIES:  10/22 CTA Chest >> neg for PE, diffuse bilateral central airspace opacification R>L with air bronchograms, diffuse coronary calcifications, healing minimally displaced fracture of R lateral 6th rib 10/22 ECHO >> EF 45-50%, mild diffuse hypokinesis 10/22 Admit with diffuse bilateral airspace disease. CT negative for PE. ARDS PROTOCOL with NIMBEX - severe ARDS  SIGNIFICANT EVENTS: 10/22 - Intubated, admitted 10/24 - Bronch performed, no pus seen, started on solumedrol 10/25 - Oliguric, CRRT started  SUBJECTIVE:  Intubated, sedated, no events overnight.  FiO2 decreasing  VITAL SIGNS: Temp:  [94.1 F (34.5 C)-97.7 F (36.5 C)] 94.1 F (34.5 C) (10/27 0600) Pulse Rate:  [72-93] 92 (10/27 0740) Resp:  [35] 35 (10/27 0740) BP: (90-163)/(47-90) 141/58 mmHg (10/27 0740) SpO2:  [88 %-97 %] 93 % (10/27 0740) Arterial Line BP: (107-197)/(47-70) 142/57 mmHg (10/27 0700) FiO2 (%):  [80 %-90 %] 80 % (10/27 0740) Weight:  [370 lb (167.831 kg)] 370 lb (167.831 kg) (10/27 0500)  HEMODYNAMICS: CVP:  [7 mmHg-13 mmHg] 9 mmHg  VENTILATOR SETTINGS: Vent Mode:  [-] PRVC FiO2 (%):  [80 %-90 %] 80 % Set Rate:  [35 bmp] 35 bmp Vt Set:  [510 mL] 510 mL PEEP:  [14 cmH20-18 cmH20] 14 cmH20 Plateau Pressure:  [23 cmH20-30 cmH20] 23 cmH20  INTAKE / OUTPUT:  Intake/Output Summary (Last 24 hours) at 11/27/14 0811 Last data filed at 11/27/14 0700  Gross per 24 hour  Intake 2892.57 ml  Output   7800 ml  Net -4907.43 ml   PHYSICAL EXAMINATION: General:  64yo male, intubated, sedated and paralyzed. Neuro:  Sedated, paralyzed HEENT:  OETT, MMM Cardiovascular:  S1s2, RRR Lungs:  Diffuse coarse breath sounds Abdomen:  Obese, +BS, S, ND,  NT Musculoskeletal:  No acute deformities Skin:  Dry, 2-3+ pitting edema in BLE, R great toe with small annular open area without drainage   LABS:  CBC  Recent Labs Lab 11/25/14 0419 11/26/14 0544 11/27/14 0514  WBC 4.0 8.4 11.5*  HGB 11.1* 11.3* 11.9*  HCT 34.4* 35.7* 38.4*  PLT 156 215 209   Coag's  Recent Labs Lab 11/26/14 0544 11/27/14 0514  APTT 48* 171*   BMET  Recent Labs Lab 11/26/14 0544 11/26/14 1515 11/27/14 0515  NA 136 141 135  K 4.7 4.9 4.9  CL 102 107 101  CO2 24 23 26   BUN 75* 82* 94*  CREATININE 2.43* 2.39* 2.21*  GLUCOSE 187* 157* 289*   Electrolytes  Recent Labs Lab 11/25/14 0419  11/26/14 0544 11/26/14 1515 11/27/14 0514 11/27/14 0515  CALCIUM 7.2*  < > 7.2* 7.5*  --  7.3*  MG 2.1  --  2.4  --  2.7*  --   PHOS 3.6  < > 3.9 3.9 4.5 4.5  < > = values in this interval not displayed. Sepsis Markers  Recent Labs Lab 11/08/2014 0226 11/28/2014 0518 11/19/2014 2143 11/03/2014 2151 11/23/14 0532 11/24/14 0405  LATICACIDVEN 2.16* 2.18*  --  0.9  --   --   PROCALCITON  --   --  1.80  --  1.82 2.98   ABG  Recent Labs Lab 11/26/14 0453 11/26/14 1144 11/27/14 0418  PHART 7.276* 7.259* 7.272*  PCO2ART 50.9* 53.7* 46.8*  PO2ART 83.0 90.0 86.0  Liver Enzymes  Recent Labs Lab 11/12/2014 0215 11/23/14 0532 11/24/14 0959  11/26/14 0544 11/26/14 1515 11/27/14 0515  AST 72* 44* 85*  --   --   --   --   ALT 70* 53 60  --   --   --   --   ALKPHOS 123 88 97  --   --   --   --   BILITOT 2.3* 2.2* 2.3*  --   --   --   --   ALBUMIN 2.9* 1.9* 1.8*  < > 1.6* 1.7* 1.7*  < > = values in this interval not displayed. Cardiac Enzymes  Recent Labs Lab 11/18/2014 2143 11/23/14 0532 11/23/14 1145  TROPONINI 0.10* 0.05* 0.03   Glucose  Recent Labs Lab 11/26/14 1202 11/26/14 1621 11/26/14 1709 11/26/14 1812 11/26/14 1854 11/27/14 0736  GLUCAP 138* 163* 179* 176* 175* 284*   Imaging Dg Chest Port 1 View  11/27/2014  CLINICAL DATA:   Acute respiratory failure, community-acquired pneumonia, ARDS. EXAM: PORTABLE CHEST 1 VIEW COMPARISON:  Portable chest x-ray of November 26, 2014 FINDINGS: The lungs are reasonably well inflated. Fluffy alveolar opacities persist bilaterally. The lip right hemidiaphragm is less well demonstrated today. The left hemidiaphragm remains obscured. The cardiac silhouette remains enlarged the pulmonary vascularity indistinct. The endotracheal tube tip projects 4.9 cm above the carina. The esophagogastric tube and right internal jugular venous catheter are unchanged in position. IMPRESSION: Slight interval deterioration in the appearance of the right lung base which may reflect pleural fluid and/or atelectasis or infiltrate in this patient with ARDS. Stable appearance of the lungs elsewhere. The support tubes are in reasonable position. Electronically Signed   By: David  Martinique M.D.   On: 11/27/2014 07:34   ASSESSMENT / PLAN:  PULMONARY OETT 10/22 >>  A: Acute Hypoxic Respiratory Failure - secondary to diffuse R>L bilateral infiltrates  Multi-focal PNA / CAP -  ARDS - severe Pa/FiO2 ratio 60 R/o autoimmune disorder - ANCA negative, CRP 35.2, ESR 35  P:  ARDS protocol with Nimbex since 11/15/2014 p.m. Wean PEEP/FiO2 for sats > 92%. CXR in AM. ABG in AM. D/C solumedrol.  CARDIOVASCULAR CVL L FEM 10/22 >>  A:  Soft/Normal BP - suspect sedation related. On Lasix at home  Hx HTN/HLD Venous Stasis  Echo with 45-50% EF  P:  CRRT negative 100-150 ml/hr. Daily ASA. Hold benazepril with AKI risk. Hold norvasc, cardura, fenofibrate. May need levophed if MAP <65.  RENAL A:  AKI P:  Fluids to KVO. Trend BMP/UOP. Replace electrolytes as indicated. CVVH negative 100-150  GASTROINTESTINAL A:  Vent Associated Dysphagia - intubated for respiratory failure  P:  TF per nutrition. PPI.  HEMATOLOGIC A:  No acute process  P:  Heparin for DVT prophylaxis. Transfuse per  ICU guidelines.  INFECTIOUS A:  Fever R/O Flu Sepsis  Multi-Focal Airspace Disease - concerning for CAP  P:  BCx2 10/22 >> Negative UC 10/22 >> neg Sputum 10/22 >> neg Influenza 10/22 >> neg  Influenza tracheal aspirate 10/22 >>  Respiratory virus panel tracheal aspirate October 22>> Neg Legionella 10/22 >>>Pos RVP 10/24 >>> negative Tracheal culture 10/24 >>>  Vanc 10/22 >>> 10/24 Zosyn 10/22 >>> 10/26 Linezolid 10/24 >>> Levaquin 10/24 >>>  ENDOCRINE A:  DM  P:  SSI  Lantus 20 units BID (reduced from home dose of 100 BID) Hold home glyburide-metformin  NEUROLOGIC A:  Sedation needs  P:  RASS goal: -4/BIS goal < 50 with fent/versed. Neuromuscular blockade since 11/14/2014  pm. ARDS protocol. Fentanyl gtt / Versed gtts.  FAMILY  - Updates: Wife and daughter updated bedside.  - Inter-disciplinary family meet or Palliative Care meeting due by: 10/30  Algis Greenhouse. Jerline Pain, [place] Resident PGY-2 11/27/2014 8:11 AM   Attending Note:  64 year old with ARDS and fluid overload, respiratory failure, intubated and paralyzed.  CXR clearing slowly.  On exam crackles diffusely.  FiO2 down to 80% and PEEP down to 14, will continue to titrate FiO2 only at this point.  Once at 60 can drop PEEP further.  Family updated bedside and code status addressed as LCB with no CPR/cardioversion/trach/peg.  Will continue volume negative for now via CRRT and lasix was restarted to evaluate for response.  The patient is critically ill with multiple organ systems failure and requires high complexity decision making for assessment and support, frequent evaluation and titration of therapies, application of advanced monitoring technologies and extensive interpretation of multiple databases.   Critical Care Time devoted to patient care services described in this note is  35  Minutes. This time reflects time of care of this signee Dr Jennet Maduro. This  critical care time does not reflect procedure time, or teaching time or supervisory time of PA/NP/Med student/Med Resident etc but could involve care discussion time.  Rush Farmer, M.D. [facility] Pulmonary/Critical Care Medicine. Pager: [phone]. After hours pager: [phone].

## 2014-11-27 NOTE — Progress Notes (Signed)
Called on call Renal MD. Pt has not been tolerating fluid removal on CRRT for about the last hour. A-line MAPs have been less than 65 despite reducing the fluid removal rate with CRRT.  New orders to keep pt even per Renal.  Also, Pola CornELINK MD called and notified. CVP 10. Will continue to monitor closely.

## 2014-11-27 NOTE — Progress Notes (Signed)
Notified by nursing staff of decreased Sp02 levels(79-81%). Recruitment maneuver done the Pressure control set at 40cm, RR10,Peep 5 and Fio2 100% for two minutes. Patient did have a decrease in Blood pressure. Nursing staff at bed side. Patient is ordered on ARDS protocol so peep was increased to 20cm. Will continue to monitor blood pressure. Sp02 remains at 82%..Marland Kitchen

## 2014-11-27 NOTE — Progress Notes (Signed)
Pt was turned to right side while NP assessed need for thoracentesis. Pt did not tolerate turn. Pts o2 sats dropped to the mid to low 80's. RT called and vent settings changed.  Also, after the turn pts A-line reading no longer correlating with BP cuff. Aline re-zeroed and manipulated by RN and RT. A-line still able to be flushed and can pull back blood. Pt has strong pulse radial pulses. MD called and made aware of reading of both A-line and BP cuff. Order to go off BP cuff readings.  RT at bedside and changing dressing and tubing on a-line.  Will continue to monitor closely.

## 2014-11-28 ENCOUNTER — Inpatient Hospital Stay (HOSPITAL_COMMUNITY): Payer: BLUE CROSS/BLUE SHIELD

## 2014-11-28 LAB — BLOOD GAS, ARTERIAL
ACID-BASE DEFICIT: 3.9 mmol/L — AB (ref 0.0–2.0)
Acid-base deficit: 2.9 mmol/L — ABNORMAL HIGH (ref 0.0–2.0)
BICARBONATE: 23.3 meq/L (ref 20.0–24.0)
Bicarbonate: 24.6 mEq/L — ABNORMAL HIGH (ref 20.0–24.0)
DRAWN BY: 252031
FIO2: 1
FIO2: 100
MECHVT: 510 mL
O2 SAT: 92.9 %
O2 SAT: 96.9 %
PATIENT TEMPERATURE: 98.6
PATIENT TEMPERATURE: 98.6
PCO2 ART: 80.8 mmHg — AB (ref 35.0–45.0)
PEEP/CPAP: 20 cmH2O
PEEP: 20 cmH2O
PH ART: 7.11 — AB (ref 7.350–7.450)
PO2 ART: 90.3 mmHg (ref 80.0–100.0)
RATE: 35 resp/min
RATE: 35 resp/min
TCO2: 27 mmol/L (ref 0–100)
TCO2: 25 mmol/L (ref 0–100)
VT: 670 mL
pCO2 arterial: 55 mmHg — ABNORMAL HIGH (ref 35.0–45.0)
pH, Arterial: 7.251 — ABNORMAL LOW (ref 7.350–7.450)
pO2, Arterial: 79.1 mmHg — ABNORMAL LOW (ref 80.0–100.0)

## 2014-11-28 LAB — RENAL FUNCTION PANEL
ANION GAP: 13 (ref 5–15)
Albumin: 1.7 g/dL — ABNORMAL LOW (ref 3.5–5.0)
Albumin: 1.6 g/dL — ABNORMAL LOW (ref 3.5–5.0)
Anion gap: 7 (ref 5–15)
BUN: 105 mg/dL — ABNORMAL HIGH (ref 6–20)
BUN: 114 mg/dL — AB (ref 6–20)
CALCIUM: 7.6 mg/dL — AB (ref 8.9–10.3)
CALCIUM: 6.9 mg/dL — AB (ref 8.9–10.3)
CHLORIDE: 103 mmol/L (ref 101–111)
CO2: 23 mmol/L (ref 22–32)
CO2: 24 mmol/L (ref 22–32)
CREATININE: 2.67 mg/dL — AB (ref 0.61–1.24)
Chloride: 102 mmol/L (ref 101–111)
Creatinine, Ser: 2.74 mg/dL — ABNORMAL HIGH (ref 0.61–1.24)
GFR calc non Af Amer: 23 mL/min — ABNORMAL LOW (ref >60)
GFR calc non Af Amer: 24 mL/min — ABNORMAL LOW (ref >60)
GFR, EST AFRICAN AMERICAN: 27 mL/min — AB (ref >60)
GFR, EST AFRICAN AMERICAN: 28 mL/min — AB (ref >60)
GLUCOSE: 292 mg/dL — AB (ref 65–99)
GLUCOSE: 376 mg/dL — AB (ref 65–99)
Phosphorus: 5.1 mg/dL — ABNORMAL HIGH (ref 2.5–4.6)
Phosphorus: 4.5 mg/dL (ref 2.5–4.6)
Potassium: 5.4 mmol/L — ABNORMAL HIGH (ref 3.5–5.1)
Potassium: 5.3 mmol/L — ABNORMAL HIGH (ref 3.5–5.1)
SODIUM: 134 mmol/L — AB (ref 135–145)
Sodium: 138 mmol/L (ref 135–145)

## 2014-11-28 LAB — CBC
HCT: 41.5 % (ref 39.0–52.0)
HEMOGLOBIN: 12.9 g/dL — AB (ref 13.0–17.0)
MCH: 27.4 pg (ref 26.0–34.0)
MCHC: 31.1 g/dL (ref 30.0–36.0)
MCV: 88.3 fL (ref 78.0–100.0)
PLATELETS: 351 10*3/uL (ref 150–400)
RBC: 4.7 MIL/uL (ref 4.22–5.81)
RDW: 16.8 % — AB (ref 11.5–15.5)
WBC: 23.4 10*3/uL — ABNORMAL HIGH (ref 4.0–10.5)

## 2014-11-28 LAB — POCT ACTIVATED CLOTTING TIME
ACTIVATED CLOTTING TIME: 190 s
ACTIVATED CLOTTING TIME: 190 s
Activated Clotting Time: 171 seconds
Activated Clotting Time: 183 seconds
Activated Clotting Time: 183 seconds
Activated Clotting Time: 196 seconds
Activated Clotting Time: 202 seconds

## 2014-11-28 LAB — GLUCOSE, CAPILLARY
GLUCOSE-CAPILLARY: 240 mg/dL — AB (ref 65–99)
GLUCOSE-CAPILLARY: 162 mg/dL — AB (ref 65–99)
Glucose-Capillary: 302 mg/dL — ABNORMAL HIGH (ref 65–99)

## 2014-11-28 LAB — APTT: aPTT: 107 seconds — ABNORMAL HIGH (ref 24–37)

## 2014-11-28 LAB — MAGNESIUM: Magnesium: 2.7 mg/dL — ABNORMAL HIGH (ref 1.7–2.4)

## 2014-11-28 MED ORDER — LINEZOLID 600 MG/300ML IV SOLN
600.0000 mg | Freq: Two times a day (BID) | INTRAVENOUS | Status: DC
  Administered 2014-11-28 – 2014-11-29 (×3): 600 mg via INTRAVENOUS
  Filled 2014-11-28 (×4): qty 300

## 2014-11-28 MED ORDER — NOREPINEPHRINE BITARTRATE 1 MG/ML IV SOLN
2.0000 ug/min | INTRAVENOUS | Status: DC
  Administered 2014-11-28: 12 ug/min via INTRAVENOUS
  Filled 2014-11-28: qty 4

## 2014-11-28 MED ORDER — SODIUM CHLORIDE 0.9 % IV BOLUS (SEPSIS)
250.0000 mL | Freq: Once | INTRAVENOUS | Status: AC
  Administered 2014-11-28: 250 mL via INTRAVENOUS

## 2014-11-28 MED ORDER — PRO-STAT SUGAR FREE PO LIQD
60.0000 mL | Freq: Every day | ORAL | Status: DC
  Administered 2014-11-28 – 2014-11-29 (×6): 60 mL
  Filled 2014-11-28 (×12): qty 60

## 2014-11-28 MED ORDER — LEVOFLOXACIN IN D5W 750 MG/150ML IV SOLN
750.0000 mg | INTRAVENOUS | Status: DC
  Administered 2014-11-28: 750 mg via INTRAVENOUS
  Filled 2014-11-28 (×2): qty 150

## 2014-11-28 MED ORDER — NOREPINEPHRINE BITARTRATE 1 MG/ML IV SOLN
2.0000 ug/min | INTRAVENOUS | Status: DC
  Administered 2014-11-28: 22 ug/min via INTRAVENOUS
  Administered 2014-11-28: 35 ug/min via INTRAVENOUS
  Filled 2014-11-28 (×3): qty 16

## 2014-11-28 MED ORDER — OXEPA PO LIQD
1000.0000 mL | ORAL | Status: DC
  Administered 2014-11-28: 1000 mL
  Filled 2014-11-28 (×3): qty 1000

## 2014-11-28 MED ORDER — INSULIN GLARGINE 100 UNIT/ML ~~LOC~~ SOLN
25.0000 [IU] | Freq: Two times a day (BID) | SUBCUTANEOUS | Status: DC
  Administered 2014-11-28: 25 [IU] via SUBCUTANEOUS
  Filled 2014-11-28 (×2): qty 0.25

## 2014-11-28 NOTE — Progress Notes (Signed)
Nutrition Follow-up  DOCUMENTATION CODES:   Morbid obesity  INTERVENTION:  - Modify tube feeding via OGT to Oxepa @ 30 mL/hr (720 mL/day), with 60 mL Pro-Stat 6 times / day. This TF regime with Pro-Stat provides 2280 kcal (100%), 225 gm protein (100%), and 565 mL free water.   - RD team will continue to monitor patient for needs.    NUTRITION DIAGNOSIS:   Inadequate oral intake related to inability to eat as evidenced by NPO status.  Ongoing.  GOAL:   Provide needs based on ASPEN/SCCM guidelines  Met.   MONITOR:   TF tolerance, Weight trends, Labs, I & O's, Vent status  REASON FOR ASSESSMENT:   Consult Enteral/tube feeding initiation and management  ASSESSMENT:   64 y/o M, never smoker, with PMH of obesity, HTN, HLD, ED, venous stasis, arthritis, and DM who presented to [facility] on 10/22 with complaints of shortness of breath.Intubated on admission.  Patient being treated for ARDS. Position has been changed to supine.   TF was initiated on 10/222016 through OGT with tip in the descending dudoenum. Patient has been receiving Oxepa at goal rate of 40 mL/hr with 60 mL pro-stat 5x/day. Patient's weight appears stable, but CBG elevated. Per nurse, patient has been tolerate feedings well and will remain intubated.  New tube feeding calculated to meet updated current needs using ASPEN/SCCM guidelines. Please modify rate to new goal of 30 mL/day with 60 mL pro stat 6 times / day.    Brief NFPE performed, patient appears to be well-nourished.  Patient's wife was at bedside during visit and provided patient's usual weight (340 lbs). Patient lives at home with wife, and the family eat most of their meals out. Breakfast usually includes eggs, bacon and toast. Lunch is often a cheeseburger with fries, or a hotdog. Dinner is eaten out - patient often eats fried flounder, with baked potato and salad. Family frequents [employer], [employer], [employer], etc.   Patient  is currently intubated on ventilator support MV:22.1 Temp (24hrs), Avg:99.2 F (37.3 C), Min:98.1 F (36.7 C), Max:100.2 F (37.9 C) Propofol: none  Labs reviewed. CBG (215-315), high Mg (2.7), high K (5.4), high BUN (105), high Cr (2.74), low Ca (7.6), high P (5.1)  Medications reviewed.    Diet Order:  Diet NPO time specified  Skin:  Wound (see comment) (toe wound; old skin tear to leg)  Last BM:  10/27  Height:   Ht Readings from Last 1 Encounters:  06/11/14 _0  (1.88 m)    Weight:   Wt Readings from Last 1 Encounters:  11/28/14 359 lb (162.841 kg)    Ideal Body Weight:  89.1 kg  BMI:  Body mass index is 44.87 kg/(m^2).  Estimated Nutritional Needs:   Kcal:  2481-8590  Protein:  190-220 gm  Fluid:  2-2.5 L  EDUCATION NEEDS:   No education needs identified at this time  Kayleen Memos, Dietetic Intern 11/28/2014 9:06 AM

## 2014-11-28 NOTE — Progress Notes (Signed)
eLink Physician-Brief Progress Note Patient Name: Cannon KettleRobert F Mcavoy DOB: 10/21/1950 MRN: [id number]   Date of Service  11/28/2014  HPI/Events of Note  BP low.  CVP 9.  eICU Interventions  Will start levophed.     Intervention Category Major Interventions: Other:  Shonta Bourque 11/28/2014, 2:56 AM

## 2014-11-28 NOTE — Progress Notes (Signed)
Pt's BP low, Dr. Craige CottaSood made aware. ABG results read to Dr. Craige CottaSood: ph 7.11, CO2 81, pO2 97, bicarb 25.

## 2014-11-28 NOTE — Progress Notes (Signed)
eLink Physician-Brief Progress Note Patient Name: Brandon KettleRobert F Stimmel DOB: 05/06/1950 MRN: [id number]   Date of Service  11/28/2014  HPI/Events of Note  ABG    Component Value Date/Time   PHART 7.110* 11/28/2014 0300   PCO2ART 80.8* 11/28/2014 0300   PO2ART 79.1* 11/28/2014 0300   HCO3 24.6* 11/28/2014 0300   TCO2 27.0 11/28/2014 0300   ACIDBASEDEF 3.9* 11/28/2014 0300   O2SAT 92.9 11/28/2014 0300      eICU Interventions  Will change to 8 cc/kg on Vt and f/u ABG.     Intervention Category Major Interventions: Other:  Lanyiah Brix 11/28/2014, 3:19 AM

## 2014-11-28 NOTE — Progress Notes (Signed)
CVVHD management.  Hypotension overnight and now on levophed.  UO yest almost a liter but has decreased with hypotension.  CVP 5.  Given saline bouls and SBP improved.  K 5.4.  Rest of lytes OK.  Will keep even  And DC lasix for now as UO minimal.  Try to wean pressor.

## 2014-11-28 NOTE — Progress Notes (Signed)
PULMONARY / CRITICAL CARE MEDICINE   Name: Brandon Villarreal MRN: [id number] DOB: Dec 28, 1950    ADMISSION DATE:  11/17/2014 CONSULTATION DATE:  11/09/2014  REFERRING MD :  Dr Jerilee Hoh / THR @[facility]   CHIEF COMPLAINT:  Acute Respiratory Failure   STUDIES:  10/22 CTA Chest >> neg for PE, diffuse bilateral central airspace opacification R>L with air bronchograms, diffuse coronary calcifications, healing minimally displaced fracture of R lateral 6th rib 10/22 ECHO >> EF 45-50%, mild diffuse hypokinesis 10/22 Admit with diffuse bilateral airspace disease. CT negative for PE. ARDS PROTOCOL with NIMBEX - severe ARDS  SIGNIFICANT EVENTS: 10/22 - Intubated, admitted 10/24 - Bronch performed, no pus seen, started on solumedrol 10/25 - Oliguric, CRRT started 10/28 - Hypotensive, started on levophed  SUBJECTIVE:  Intubated, sedated. Required increased FiO2 and PEEP yesterday after turning. Also started on levophed.   VITAL SIGNS: Temp:  [98.1 F (36.7 C)-100.2 F (37.9 C)] 100.2 F (37.9 C) (10/28 0332) Pulse Rate:  [94-126] 94 (10/28 0800) Resp:  [23-35] 35 (10/28 0800) BP: (81-143)/(28-69) 143/53 mmHg (10/28 0800) SpO2:  [80 %-98 %] 98 % (10/28 0800) Arterial Line BP: (76-182)/(9-94) 132/53 mmHg (10/28 0800) FiO2 (%):  [70 %-100 %] 100 % (10/28 0800) Weight:  [359 lb (162.841 kg)] 359 lb (162.841 kg) (10/28 0500)  HEMODYNAMICS: CVP:  [5 mmHg-13 mmHg] 11 mmHg  VENTILATOR SETTINGS: Vent Mode:  [-] PRVC FiO2 (%):  [70 %-100 %] 100 % Set Rate:  [35 bmp] 35 bmp Vt Set:  [510 mL-670 mL] 670 mL PEEP:  [12 cmH20-20 cmH20] 20 cmH20 Plateau Pressure:  [24 cmH20-40 cmH20] 39 cmH20  INTAKE / OUTPUT:  Intake/Output Summary (Last 24 hours) at 11/28/14 0826 Last data filed at 11/28/14 0700  Gross per 24 hour  Intake 2991.07 ml  Output   4078 ml  Net -1086.93 ml   PHYSICAL EXAMINATION: General:  64yo male, intubated, sedated and paralyzed.  Neuro:  Sedated, paralyzed HEENT:  OETT,  MMM Cardiovascular:  S1s2, RRR Lungs:  Diffuse coarse breath sounds Abdomen:  Obese, +BS, S, ND, NT Musculoskeletal:  No acute deformities Skin:  Dry, 2-3+ pitting edema in BLE, R great toe with small annular open area without drainage   LABS:  CBC  Recent Labs Lab 11/26/14 0544 11/27/14 0514 11/28/14 0528  WBC 8.4 11.5* 23.4*  HGB 11.3* 11.9* 12.9*  HCT 35.7* 38.4* 41.5  PLT 215 209 351   Coag's  Recent Labs Lab 11/26/14 0544 11/27/14 0514 11/28/14 0655  APTT 48* 171* 107*   BMET  Recent Labs Lab 11/27/14 0515 11/27/14 1528 11/28/14 0635  NA 135 142 138  K 4.9 4.9 5.4*  CL 101 108 102  CO2 26 25 23   BUN 94* 97* 105*  CREATININE 2.21* 2.18* 2.74*  GLUCOSE 289* 262* 292*   Electrolytes  Recent Labs Lab 11/26/14 0544  11/27/14 0514 11/27/14 0515 11/27/14 1528 11/28/14 0635  CALCIUM 7.2*  < >  --  7.3* 7.5* 7.6*  MG 2.4  --  2.7*  --   --  2.7*  PHOS 3.9  < > 4.5 4.5 4.5 5.1*  < > = values in this interval not displayed. Sepsis Markers  Recent Labs Lab 11/05/2014 0226 11/20/2014 0518 11/09/2014 2143 11/30/2014 2151 11/23/14 0532 11/24/14 0405  LATICACIDVEN 2.16* 2.18*  --  0.9  --   --   PROCALCITON  --   --  1.80  --  1.82 2.98   ABG  Recent Labs Lab 11/27/14 0418 11/28/14 0300  11/28/14 0527  PHART 7.272* 7.110* 7.251*  PCO2ART 46.8* 80.8* 55.0*  PO2ART 86.0 79.1* 90.3   Liver Enzymes  Recent Labs Lab 11/24/2014 0215 11/23/14 0532 11/24/14 0959  11/27/14 0515 11/27/14 1528 11/28/14 0635  AST 72* 44* 85*  --   --   --   --   ALT 70* 53 60  --   --   --   --   ALKPHOS 123 88 97  --   --   --   --   BILITOT 2.3* 2.2* 2.3*  --   --   --   --   ALBUMIN 2.9* 1.9* 1.8*  < > 1.7* 1.7* 1.7*  < > = values in this interval not displayed. Cardiac Enzymes  Recent Labs Lab 11/23/2014 2143 11/23/14 0532 11/23/14 1145  TROPONINI 0.10* 0.05* 0.03   Glucose  Recent Labs Lab 11/26/14 1854 11/26/14 1929 11/27/14 0736 11/27/14 1521  11/27/14 1937 11/28/14 0334  GLUCAP 175* 165* 284* 244* 219* 240*   Imaging Dg Chest Port 1 View  11/28/2014  CLINICAL DATA:  Acute respiratory failure, community-acquired pneumonia, sepsis, ARDS, intubated patient. EXAM: PORTABLE CHEST 1 VIEW COMPARISON:  Portable chest x-ray of November 27, 2014 FINDINGS: The patient is rotated on this study. Since yesterday's study there has developed further airspace opacity in the left upper lobe. The interstitial markings of both lungs remain increased and airspace opacity in the right upper and lower lobes persist. The heart is top-normal in size. The pulmonary vascularity is mildly prominent but not clearly engorged. The endotracheal tube tip lies 7.4 cm above the carina. The esophagogastric tube tip projects below the inferior margin of the image. The right internal jugular venous catheter tip projects over the proximal SVC. IMPRESSION: Allowing for differences in positioning there has been interval worsening of airspace opacity in the left upper lobe. Persistent airspace opacities in the right lung are stable. The findings are consistent with multifocal pneumonia though ARDS may be complicating the picture. Advancement of the endotracheal tube by 2-3 cm would be useful. Electronically Signed   By: David  Martinique M.D.   On: 11/28/2014 07:23   ASSESSMENT / PLAN:  PULMONARY OETT 10/22 >>  A: Acute Hypoxic Respiratory Failure - secondary to diffuse R>L bilateral infiltrates  Multi-focal PNA / CAP -  ARDS - severe Pa/FiO2 ratio 60 R/o autoimmune disorder - ANCA negative, CRP 35.2, ESR 35 P:  ARDS protocol with Nimbex since 11/08/2014 p.m. Wean PEEP/FiO2 for sats > 92%. CXR in AM. ABG in AM. D/C solumedrol. Check echo with a bubble study to evaluate for a shunt. Maintain paralyzed for now given FiO2 of 100% and PEEP 20 cmH2O.  CARDIOVASCULAR CVL L FEM 10/22 >>  A:  Soft/Normal BP - suspect sedation related. On Lasix at home  Hx HTN/HLD Venous  Stasis  Echo with 45-50% EF P:  Daily ASA. Hold benazepril with AKI risk. Hold norvasc, cardura, fenofibrate. Pressor support as needed to keep MAP <65, CVP is 11 after fluid bolus.  RENAL A:  AKI P:  Fluids to KVO. Trend BMP/UOP. Replace electrolytes as indicated. CRRT net even  GASTROINTESTINAL A:  Vent Associated Dysphagia - intubated for respiratory failure  P:  TF per nutrition. PPI.  HEMATOLOGIC A:  No acute process  P:  Heparin for DVT prophylaxis. Transfuse per ICU guidelines.  INFECTIOUS A:  Fever R/O Flu Sepsis  Multi-Focal Airspace Disease - concerning for CAP  P:  BCx2 10/22 >> Negative UC 10/22 >> neg  Sputum 10/22 >> neg Influenza 10/22 >> neg  Influenza tracheal aspirate 10/22 >> Neg Respiratory virus panel tracheal aspirate October 22>> Neg Legionella 10/22 >>>Pos RVP 10/24 >>> negative Tracheal culture 10/24 >>>  Vanc 10/22 >>> 10/24 Zosyn 10/22 >>> 10/26 Linezolid 10/24 >>>10/26>>>10/28>>> Levaquin 10/24 >>>  ENDOCRINE A:  DM  P:  SSI  Increase Lantus from 20 to 25 units BID (reduced from home dose of 100 BID) Hold home glyburide-metformin  NEUROLOGIC A:  Sedation needs  P:  RASS goal: -4/BIS goal < 50 with fent/versed. Neuromuscular blockade since 11/02/2014 pm. ARDS protocol. Fentanyl gtt / Versed gtts.  FAMILY  - Updates: Wife and daughter updated bedside.  - Inter-disciplinary family meet or Palliative Care meeting due by: 10/30  Algis Greenhouse. Jerline Pain, [place] Resident PGY-2 11/28/2014 8:26 AM   Attending Note:  64 year old male with ARDS and fluid overload and acute renal failure on CRRT currently fluid even.  Dropped BP overnight and now fluid even.  Will allow for equalization of fluid prior to more volume negative.  What is more disturbing is that the patient is requiring 100% FiO2 and PEEP of 12.  PE is highly unlikely given no DVT and heparin for DVT  prophylaxis.  Shunting is a possibility specially with clearing lungs.  Will order an echo with bubble study to r/o shunting.  Continue pressors for now, follow CVP and CRRT even.  I updated wife at length and expressed out concern for his chances of survival at this point.  The patient is critically ill with multiple organ systems failure and requires high complexity decision making for assessment and support, frequent evaluation and titration of therapies, application of advanced monitoring technologies and extensive interpretation of multiple databases.   Critical Care Time devoted to patient care services described in this note is  35  Minutes. This time reflects time of care of this signee Dr Jennet Maduro. This critical care time does not reflect procedure time, or teaching time or supervisory time of PA/NP/Med student/Med Resident etc but could involve care discussion time.  Rush Farmer, M.D. [facility] Pulmonary/Critical Care Medicine. Pager: [phone]. After hours pager: [phone].

## 2014-11-29 ENCOUNTER — Inpatient Hospital Stay (HOSPITAL_COMMUNITY): Payer: BLUE CROSS/BLUE SHIELD

## 2014-11-29 ENCOUNTER — Other Ambulatory Visit (HOSPITAL_COMMUNITY): Payer: 59

## 2014-11-29 LAB — RENAL FUNCTION PANEL
Albumin: 1.5 g/dL — ABNORMAL LOW (ref 3.5–5.0)
Anion gap: 11 (ref 5–15)
BUN: 111 mg/dL — ABNORMAL HIGH (ref 6–20)
CALCIUM: 7.5 mg/dL — AB (ref 8.9–10.3)
CO2: 23 mmol/L (ref 22–32)
CREATININE: 2.86 mg/dL — AB (ref 0.61–1.24)
Chloride: 103 mmol/L (ref 101–111)
GFR calc Af Amer: 25 mL/min — ABNORMAL LOW (ref >60)
GFR calc non Af Amer: 22 mL/min — ABNORMAL LOW (ref >60)
Glucose, Bld: 303 mg/dL — ABNORMAL HIGH (ref 65–99)
Phosphorus: 5 mg/dL — ABNORMAL HIGH (ref 2.5–4.6)
Potassium: 5.2 mmol/L — ABNORMAL HIGH (ref 3.5–5.1)
SODIUM: 137 mmol/L (ref 135–145)

## 2014-11-29 LAB — CBC
HEMATOCRIT: 38.9 % — AB (ref 39.0–52.0)
HEMOGLOBIN: 12.4 g/dL — AB (ref 13.0–17.0)
MCH: 27.7 pg (ref 26.0–34.0)
MCHC: 31.9 g/dL (ref 30.0–36.0)
MCV: 87 fL (ref 78.0–100.0)
Platelets: 216 10*3/uL (ref 150–400)
RBC: 4.47 MIL/uL (ref 4.22–5.81)
RDW: 16.5 % — ABNORMAL HIGH (ref 11.5–15.5)
WBC: 21.3 10*3/uL — ABNORMAL HIGH (ref 4.0–10.5)

## 2014-11-29 LAB — GLUCOSE, CAPILLARY
Glucose-Capillary: 259 mg/dL — ABNORMAL HIGH (ref 65–99)
Glucose-Capillary: 262 mg/dL — ABNORMAL HIGH (ref 65–99)
Glucose-Capillary: 317 mg/dL — ABNORMAL HIGH (ref 65–99)

## 2014-11-29 LAB — APTT: APTT: 101 s — AB (ref 24–37)

## 2014-11-29 LAB — MAGNESIUM: MAGNESIUM: 2.9 mg/dL — AB (ref 1.7–2.4)

## 2014-11-29 LAB — POCT ACTIVATED CLOTTING TIME
ACTIVATED CLOTTING TIME: 183 s
Activated Clotting Time: 177 seconds

## 2014-11-29 MED ORDER — INSULIN GLARGINE 100 UNIT/ML ~~LOC~~ SOLN
30.0000 [IU] | Freq: Two times a day (BID) | SUBCUTANEOUS | Status: DC
  Administered 2014-11-29: 30 [IU] via SUBCUTANEOUS
  Filled 2014-11-29 (×2): qty 0.3

## 2014-12-01 LAB — GLUCOSE, CAPILLARY
GLUCOSE-CAPILLARY: 348 mg/dL — AB (ref 65–99)
Glucose-Capillary: 348 mg/dL — ABNORMAL HIGH (ref 65–99)

## 2014-12-02 ENCOUNTER — Telehealth: Payer: Self-pay

## 2014-12-02 NOTE — Progress Notes (Signed)
PULMONARY / CRITICAL CARE MEDICINE   Name: Brandon Villarreal MRN: [id number] DOB: 09/27/1950    ADMISSION DATE:  11/18/2014 CONSULTATION DATE:  11/15/2014  REFERRING MD :  Dr Jerilee Hoh / [facility] @[facility]   CHIEF COMPLAINT:  Acute Respiratory Failure   STUDIES:  10/22 CTA Chest >> neg for PE, diffuse bilateral central airspace opacification R>L with air bronchograms, diffuse coronary calcifications, healing minimally displaced fracture of R lateral 6th rib 10/22 ECHO >> EF 45-50%, mild diffuse hypokinesis 10/22 Admit with diffuse bilateral airspace disease. CT negative for PE. ARDS PROTOCOL with NIMBEX - severe ARDS  SIGNIFICANT EVENTS: 10/22 - Intubated, admitted 10/24 - Bronch performed, no pus seen, started on solumedrol 10/25 - Oliguric, CRRT started 10/28 - Hypotensive, started on levophed  SUBJECTIVE:  Intubated, sedated. Continues to require high FiO2 and PEEP.   VITAL SIGNS: Temp:  [99 F (37.2 C)-99.6 F (37.6 C)] 99 F (37.2 C) (10/29 0345) Pulse Rate:  [44-116] 93 (10/29 0700) Resp:  [21-35] 35 (10/29 0700) BP: (106-160)/(48-62) 106/56 mmHg (10/29 0600) SpO2:  [84 %-99 %] 92 % (10/29 0700) Arterial Line BP: (101-167)/(48-83) 106/49 mmHg (10/29 0700) FiO2 (%):  [90 %-100 %] 90 % (10/29 0417) Weight:  [360 lb (163.295 kg)] 360 lb (163.295 kg) (10/29 0500)  HEMODYNAMICS: CVP:  [10 mmHg-11 mmHg] 10 mmHg  VENTILATOR SETTINGS: Vent Mode:  [-] PRVC FiO2 (%):  [90 %-100 %] 90 % Set Rate:  [35 bmp] 35 bmp Vt Set:  [670 mL] 670 mL PEEP:  [18 cmH20-20 cmH20] 18 cmH20 Plateau Pressure:  [34 cmH20-40 cmH20] 34 cmH20  INTAKE / OUTPUT:  Intake/Output Summary (Last 24 hours) at 12/18/2014 0746 Last data filed at Dec 18, 2014 0700  Gross per 24 hour  Intake 3001.39 ml  Output   2930 ml  Net  71.39 ml   PHYSICAL EXAMINATION: General:  64yo male, intubated, sedated and paralyzed.  Neuro:  Sedated, paralyzed HEENT:  OETT, MMM Cardiovascular:  S1s2, RRR Lungs:  Diffuse coarse  breath sounds Abdomen:  Obese, +BS, S, ND, NT Musculoskeletal:  No acute deformities Skin:  Dry, 2-3+ pitting edema in BLE, R great toe with small annular open area without drainage   LABS:  CBC  Recent Labs Lab 11/27/14 0514 11/28/14 0528 December 18, 2014 0517  WBC 11.5* 23.4* 21.3*  HGB 11.9* 12.9* 12.4*  HCT 38.4* 41.5 38.9*  PLT 209 351 216   Coag's  Recent Labs Lab 11/27/14 0514 11/28/14 0655 2014/12/18 0517  APTT 171* 107* 101*   BMET  Recent Labs Lab 11/28/14 0635 11/28/14 1535 12-18-2014 0517  NA 138 134* 137  K 5.4* 5.3* 5.2*  CL 102 103 103  CO2 23 24 23   BUN 105* 114* 111*  CREATININE 2.74* 2.67* 2.86*  GLUCOSE 292* 376* 303*   Electrolytes  Recent Labs Lab 11/27/14 0514  11/28/14 0635 11/28/14 1535 December 18, 2014 0517  CALCIUM  --   < > 7.6* 6.9* 7.5*  MG 2.7*  --  2.7*  --  2.9*  PHOS 4.5  < > 5.1* 4.5 5.0*  < > = values in this interval not displayed. Sepsis Markers  Recent Labs Lab 11/07/2014 2143 11/12/2014 2151 11/23/14 0532 11/24/14 0405  LATICACIDVEN  --  0.9  --   --   PROCALCITON 1.80  --  1.82 2.98   ABG  Recent Labs Lab 11/27/14 0418 11/28/14 0300 11/28/14 0527  PHART 7.272* 7.110* 7.251*  PCO2ART 46.8* 80.8* 55.0*  PO2ART 86.0 79.1* 90.3   Liver Enzymes  Recent Labs  Lab 11/23/14 0532 11/24/14 0959  11/28/14 0635 11/28/14 1535 12-18-2014 0517  AST 44* 85*  --   --   --   --   ALT 53 60  --   --   --   --   ALKPHOS 88 97  --   --   --   --   BILITOT 2.2* 2.3*  --   --   --   --   ALBUMIN 1.9* 1.8*  < > 1.7* 1.6* 1.5*  < > = values in this interval not displayed. Cardiac Enzymes  Recent Labs Lab 11/27/2014 2143 11/23/14 0532 11/23/14 1145  TROPONINI 0.10* 0.05* 0.03   Glucose  Recent Labs Lab 11/27/14 1937 11/28/14 0334 11/28/14 0811 11/28/14 1125 11/28/14 2345 12-18-2014 0358  GLUCAP 219* 240* 302* 162* 317* 262*   Imaging No results found. ASSESSMENT / PLAN:  PULMONARY OETT 10/22 >>  A: Acute Hypoxic  Respiratory Failure - secondary to diffuse R>L bilateral infiltrates  Multi-focal PNA / CAP - legionella pos ARDS - severe Pa/FiO2 ratio 60 R/o autoimmune disorder - ANCA negative, CRP 35.2, ESR 35 P:  ARDS protocol with Nimbex since 11/28/2014 p.m. Wean PEEP/FiO2 for sats > 92%. CXR in AM. ABG in AM. D/C solumedrol 10/27. Check echo with a bubble study to evaluate for a shunt. Maintain paralyzed for now given FiO2 of 90% and PEEP 18 cmH2O.  CARDIOVASCULAR CVL L FEM 10/22 >>  A:  Shock, ? Sepsis, component sedation and cardiogenic- Hx HTN/HLD Venous Stasis on lasix at home Echo with 45-50% EF P:  Daily ASA. Hold benazepril with AKI risk. Hold norvasc, cardura, fenofibrate. Norepi for  MAP > 65. Volume removal on CVVHD as below; CVP goal 5-7   RENAL A:  AKI Hyperkalemia P:  Fluids to KVO. Trend BMP/UOP. Replace electrolytes as indicated. CRRT net even  GASTROINTESTINAL A:  Vent Associated Dysphagia - intubated for respiratory failure  P:  TF per nutrition. PPI.  HEMATOLOGIC A:  No acute process  P:  Heparin for DVT prophylaxis. Transfuse per ICU guidelines.  INFECTIOUS A:  Sepsis  Multi-Focal Airspace Disease - concerning for CAP, Legionella positive P:  BCx2 10/22 >> Negative UC 10/22 >> neg Sputum 10/22 >> neg Influenza 10/22 >> neg  Influenza tracheal aspirate 10/22 >> Neg Respiratory virus panel tracheal aspirate October 22>> Neg Legionella 10/22 >>>Positive RVP 10/24 >>> negative Tracheal culture 10/24 >>> Neg  Vanc 10/22 >>> 10/24 Zosyn 10/22 >>> 10/26 Linezolid 10/24 >>>10/26>>>10/28>>> Levaquin 10/24 >>>  Linezolid was added back empirically after he decompensated.   ENDOCRINE A:  DM  P:  SSI  Increase Lantus from 25 to 30 units BID (reduced from home dose of 100 BID) Hold home glyburide-metformin  NEUROLOGIC A:  Sedation needs  P:  RASS goal: -4/BIS goal < 50 with  fent/versed. Neuromuscular blockade since 11/12/2014 pm. ARDS protocol. Fentanyl gtt / Versed gtts.  FAMILY  - Updates: Wife and daughter updated bedside.  - Inter-disciplinary family meet or Palliative Care meeting due by: 10/30  Algis Greenhouse. Jerline Pain, [place] Resident PGY-2 12/18/2014 7:46 AM    Attending Note:  I have examined patient, reviewed labs, studies and notes. I have discussed the case with Dr Jerline Pain, and I agree with the data and plans as amended above. Obese man, in ARDS and septic shock from CAP, apparent legionella. On 10/29 he remains sedated and paralyzed on high Fio2 and PEEP. He is on norepi at 51. Plan to decrease UF  to keep I/O even, continue abx and vent support. If no progress through weekend then will need to consider withdrawal of care. Independent critical care time is 35 minutes.   Baltazar Apo, MD, PhD Dec 02, 2014, 9:24 AM [facility] [phone] or if no answer [phone]

## 2014-12-02 NOTE — Progress Notes (Signed)
   11/28/2014 1300  Clinical Encounter Type  Visited With Patient and family together;Health care provider  Visit Type Critical Care;Death;Patient actively dying  Referral From Nurse;Care management  Spiritual Encounters  Spiritual Needs Sacred text;Prayer;Ritual;Emotional;Grief support  Stress Factors  Family Stress Factors Loss   Chaplain prayed with family and gave emotional support.

## 2014-12-02 NOTE — Progress Notes (Signed)
CVVHD Management.  Still on levophed but lower dose.  CVP 11.  K 5.2 but rest of lytes OK.  Cont to keep even for now.  Filter changes yest as reached 72hr.

## 2014-12-02 NOTE — Procedures (Signed)
Extubation Procedure Note  Patient Details:   Name: Brandon Villarreal DOB: 03/08/1950 MRN: [id number]   Airway Documentation:  Airway 8 mm (Active)  Secured at (cm) 22 cm 08-18-14 11:34 AM  Measured From Lips 08-18-14 11:34 AM  Secured Location Left 08-18-14 11:34 AM  Secured By [employer] Tube Holder 08-18-14 11:34 AM  Tube Holder Repositioned Yes 08-18-14 11:34 AM  Cuff Pressure (cm H2O) 30 cm H2O 08-18-14  7:45 AM  Site Condition Dry 08-18-14 11:34 AM    Evaluation  O2 sats: currently acceptable Complications: Complications of terminal  Patient did not tolerate procedure well. Bilateral Breath Sounds: Diminished Suctioning: Oral, Airway No  Brandon Villarreal, Brandon Villarreal 08-18-14, 1:17 PM

## 2014-12-02 NOTE — Progress Notes (Signed)
12:45 CRRT and Nimbex d/c 13:30 [facility] referral made  13:15 Pt extubated and levophed d/c, comfort care measures started  13:30 Pt asystole on monitor, no lung or heart sounds auscultated for 60s, verified by 2 nurses- Sinclair GroomsNivi Orilla Templeman, RN, Philippa SicksMelinda Macasero, RN. Family and chaplain present at bedside. 13:40 Dr. Delton CoombesByrum made aware of time of cardiac death, [facility] notified- pt eligible for tissue donation  No autopsy requested per family and doctor  No pt or family belongings in room  Death checklist completed

## 2014-12-02 NOTE — Telephone Encounter (Signed)
On 12/02/2014 I received a death certificate from [facility] [employer] [facility]. The death certificate is for cremation. The patient is a patient of Doctor Byrum. The death certificate will [facility] this pm for signature. On 12/03/2014 I received the death certificate back from Doctor Byrum. I got the death certificate ready and faxed the death certificate to the funeral home per their request.

## 2014-12-02 DEATH — deceased

## 2014-12-05 ENCOUNTER — Telehealth: Payer: Self-pay

## 2014-12-05 NOTE — Telephone Encounter (Signed)
On 12/05/2014 I received the original death certificate from [facility]. The death certificate is for cremation. The patient is a patient of Doctor Byrum. The death certificate will be [facility] on the 14th due to Doctor Byrum being on vacation till the 14th of November. On 12/18/2014 I received the death certificate back from Doctor Byrum. I got the death certificate ready and called the funeral home to let them know I was mailing the original death certificate [facility] per their request.

## 2014-12-20 LAB — FUNGUS CULTURE W SMEAR: Fungal Smear: NONE SEEN

## 2015-01-06 LAB — AFB CULTURE WITH SMEAR (NOT AT ARMC): Acid Fast Smear: NONE SEEN

## 2015-02-01 NOTE — Discharge Summary (Signed)
PULMONARY / CRITICAL CARE MEDICINE   Name: Brandon KettleRobert F Villarreal MRN: [id number] DOB: 06/14/1950    ADMISSION DATE:  29-Jan-2015 DATE OF DEATH: 11/21/2014  CHIEF COMPLAINT:  Acute Respiratory Failure   Final cause of death Acute respiratory failure  Secondary causes of death ARDS with bilateral pulmonary infiltrates Presumed multifocal community-acquired pneumonia, probable Legionella based on culture results Septic shock Chronic diastolic and systolic heart failure Acute renal failure Acute metabolic acidosis Lactic acidosis Protein calorie malnutrition Diabetes mellitus with acute hyperglycemia Acute metabolic encephalopathy Hyperkalemia History of hypertension History of hyperlipidemia History of obesity    STUDIES:  10/22 CTA Chest >> neg for PE, diffuse bilateral central airspace opacification R>L with air bronchograms, diffuse coronary calcifications, healing minimally displaced fracture of R lateral 6th rib 10/22 ECHO >> EF 45-50%, mild diffuse hypokinesis 10/22 Admit with diffuse bilateral airspace disease. CT negative for PE. ARDS PROTOCOL with NIMBEX - severe ARDS  SIGNIFICANT EVENTS: 10/22 - Intubated, admitted 10/24 - Bronch performed, no pus seen, started on solumedrol 10/25 - Oliguric, CRRT started 10/28 - Hypotensive, started on levophed   BRIEF HOSPITAL COURSE:  65 y/o M, never smoker, with PMH of obesity, HTN, HLD, ED, venous stasis, arthritis, and DM who [facility] with complaints of shortness of breath.   Notes reflect the patient reported he had been short of breath for four days, subjective fever, and dry cough. Shortness of breath became acutely worse after his wife was using acrylic pain for Christmas ornaments. In the ER, he was noted to be hypoxic with saturations in the low 80's. He was treated with BiPAP. D-Dimer was elevated at 6.12. Given hypoxia and SOB a CTA of the chest was assessed to rule out PE which was negative but  showed diffuse bilateral central airspace opacification R>L with air bronchograms, diffuse coronary calcifications, healing minimally displaced fracture of R lateral 6th rib. He also was noted to have increased LE edema (on lasix at home).  He developed fever to 101.4 and failed bipap therapy. Pa/FiO2 ratio on 90% O2 was 141. The patient was intubated per anesthesia and [facility]. Respiratory support was initiated with ARDS protocol ventilator settings. He required paralysis, high PEEP and FiO2. He evolved shock presumably septic shock with possible contribution of primary cardiogenic shock. He was supported with pressors. He was treated empirically with broad-spectrum antibiotics for multifocal community required pneumonia. His Legionella urinary antigen came back positive, respiratory virus panel was negative. Given his failure to improve on antibiotics possible autoimmune inflammatory pulmonary process was entertained and ANCA serologies were checked. These were negative. Bronchoscopy was performed on 10/24 and an absence of significant secretions he was started on empiric steroids. An echocardiogram identified some left ventricular dysfunction with an LVEF of 45-50%. He developed acute renal failure with total body volume overload and CVVHD was initiated. Despite all the interventions outlined the patient's respiratory status and hemodynamic status did not show any improvement. Discussions were undertaken with the patient's family and the decision was made to withdraw extraordinary support on 11/08/2014. He expired on that same day.     Levy Pupaobert Byrum, MD, PhD 01/01/2015, 8:22 AM [facility] [phone] or if no answer [phone]

## 2015-02-01 NOTE — Progress Notes (Signed)
PULMONARY / CRITICAL CARE MEDICINE   Name: Brandon Villarreal MRN: [id number] DOB: 06/28/1950    ADMISSION DATE:  12-31-14 DATE OF DEATH: 11/27/2014  CHIEF COMPLAINT:  Acute Respiratory Failure   Final cause of death Acute respiratory failure  Secondary causes of death ARDS with bilateral pulmonary infiltrates Presumed multifocal community-acquired pneumonia, probable Legionella based on culture results Septic shock Chronic diastolic and systolic heart failure Acute renal failure Acute metabolic acidosis Lactic acidosis Protein calorie malnutrition Diabetes mellitus with acute hyperglycemia Acute metabolic encephalopathy Hyperkalemia History of hypertension History of hyperlipidemia History of obesity    STUDIES:  10/22 CTA Chest >> neg for PE, diffuse bilateral central airspace opacification R>L with air bronchograms, diffuse coronary calcifications, healing minimally displaced fracture of R lateral 6th rib 10/22 ECHO >> EF 45-50%, mild diffuse hypokinesis 10/22 Admit with diffuse bilateral airspace disease. CT negative for PE. ARDS PROTOCOL with NIMBEX - severe ARDS  SIGNIFICANT EVENTS: 10/22 - Intubated, admitted 10/24 - Bronch performed, no pus seen, started on solumedrol 10/25 - Oliguric, CRRT started 10/28 - Hypotensive, started on levophed   BRIEF HOSPITAL COURSE:  65 y/o M, never smoker, with PMH of obesity, HTN, HLD, ED, venous stasis, arthritis, and DM [facility] with complaints of shortness of breath.   Notes reflect the patient reported he had been short of breath for four days, subjective fever, and dry cough. Shortness of breath became acutely worse after his wife was using acrylic pain for Christmas ornaments. In the ER, he was noted to be hypoxic with saturations in the low 80's. He was treated with BiPAP. D-Dimer was elevated at 6.12. Given hypoxia and SOB a CTA of the chest was assessed to rule out PE which was negative but  showed diffuse bilateral central airspace opacification R>L with air bronchograms, diffuse coronary calcifications, healing minimally displaced fracture of R lateral 6th rib. He also was noted to have increased LE edema (on lasix at home).  He developed fever to 101.4 and failed bipap therapy. Pa/FiO2 ratio on 90% O2 was 141. The patient was intubated per anesthesia and [facility]. Respiratory support was initiated with ARDS protocol ventilator settings. He required paralysis, high PEEP and FiO2. He evolved shock presumably septic shock with possible contribution of primary cardiogenic shock. He was supported with pressors. He was treated empirically with broad-spectrum antibiotics for multifocal community required pneumonia. His Legionella urinary antigen came back positive, respiratory virus panel was negative. Given his failure to improve on antibiotics possible autoimmune inflammatory pulmonary process was entertained and ANCA serologies were checked. These were negative. Bronchoscopy was performed on 10/24 and an absence of significant secretions he was started on empiric steroids. An echocardiogram identified some left ventricular dysfunction with an LVEF of 45-50%. He developed acute renal failure with total body volume overload and CVVHD was initiated. Despite all the interventions outlined the patient's respiratory status and hemodynamic status did not show any improvement. Discussions were undertaken with the patient's family and the decision was made to withdraw extraordinary support on 11/12/2014. He expired on that same day.     Levy Pupaobert Marian Grandt, MD, PhD 01/01/2015, 8:06 AM  [facility] [phone] or if no answer [phone]

## 2016-07-09 IMAGING — CR DG CHEST 1V PORT
1 series · 1 of 1 positions shown · non-contrast
Comparison: Portable chest x-ray November 25, 2014

CLINICAL DATA: Acute respiratory failure, history of
community-acquired pneumonia, sepsis, ARDS, acute renal failure.

EXAM:
PORTABLE CHEST 1 VIEW

[AP]
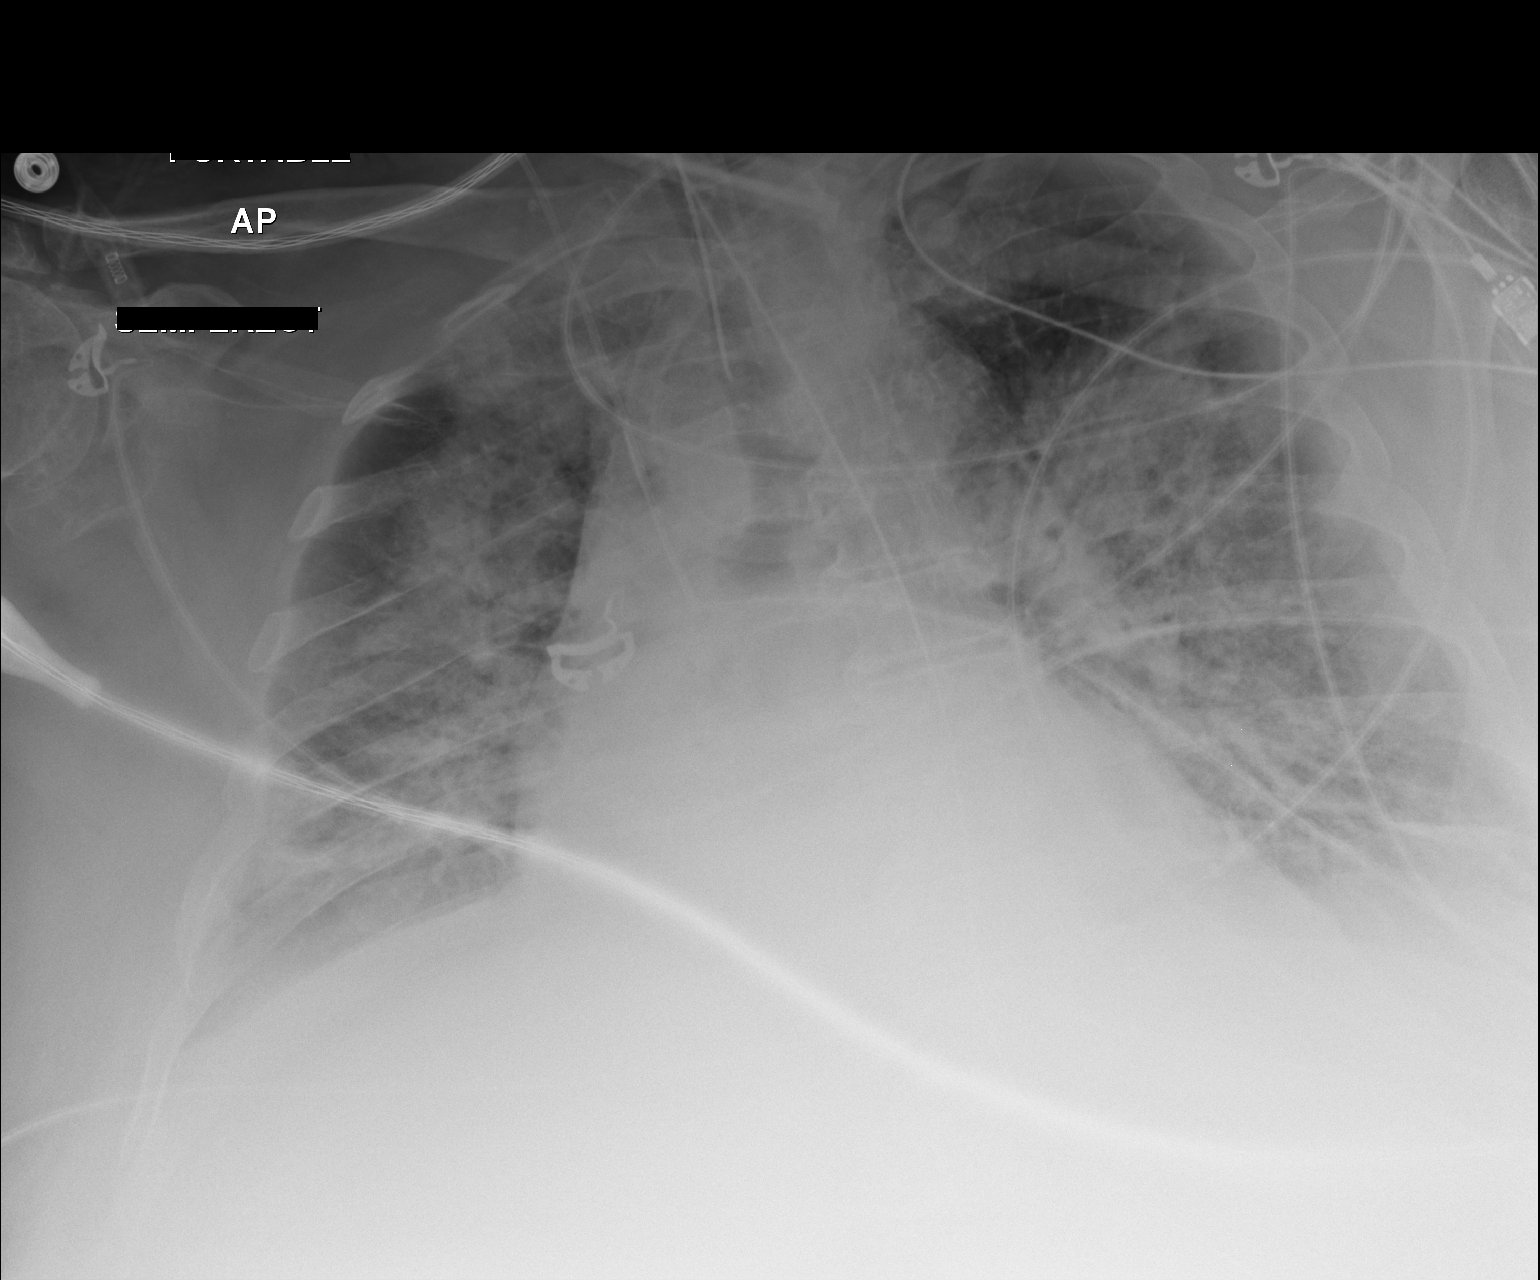

[1 of 1 positions shown; findings below may reference images not displayed]

FINDINGS: The lungs are reasonably well inflated. There are come persistent
bilateral confluent alveolar airspace opacities. The left
hemidiaphragm is less well demonstrated today. The cardiac
silhouette is mildly enlarged. The pulmonary vascularity is
indistinct. No pleural effusion or pneumothorax is observed. The
endotracheal tube tip lies 5.6 cm above the carina. The
esophagogastric tube tip projects below the inferior margin of the
image. The right internal jugular venous catheter tip projects over
the midportion of the SVC.
IMPRESSION: Stable appearance of the chest with fluffy alveolar infiltrates
consistent with ARDS -pneumonia. The support tubes are in reasonable
position.

## 2016-07-11 IMAGING — CR DG CHEST 1V PORT
1 series · 1 of 1 positions shown · non-contrast
Comparison: Portable chest x-ray November 27, 2014

CLINICAL DATA: Acute respiratory failure, community-acquired
pneumonia, sepsis, ARDS, intubated patient.

EXAM:
PORTABLE CHEST 1 VIEW

[AP]
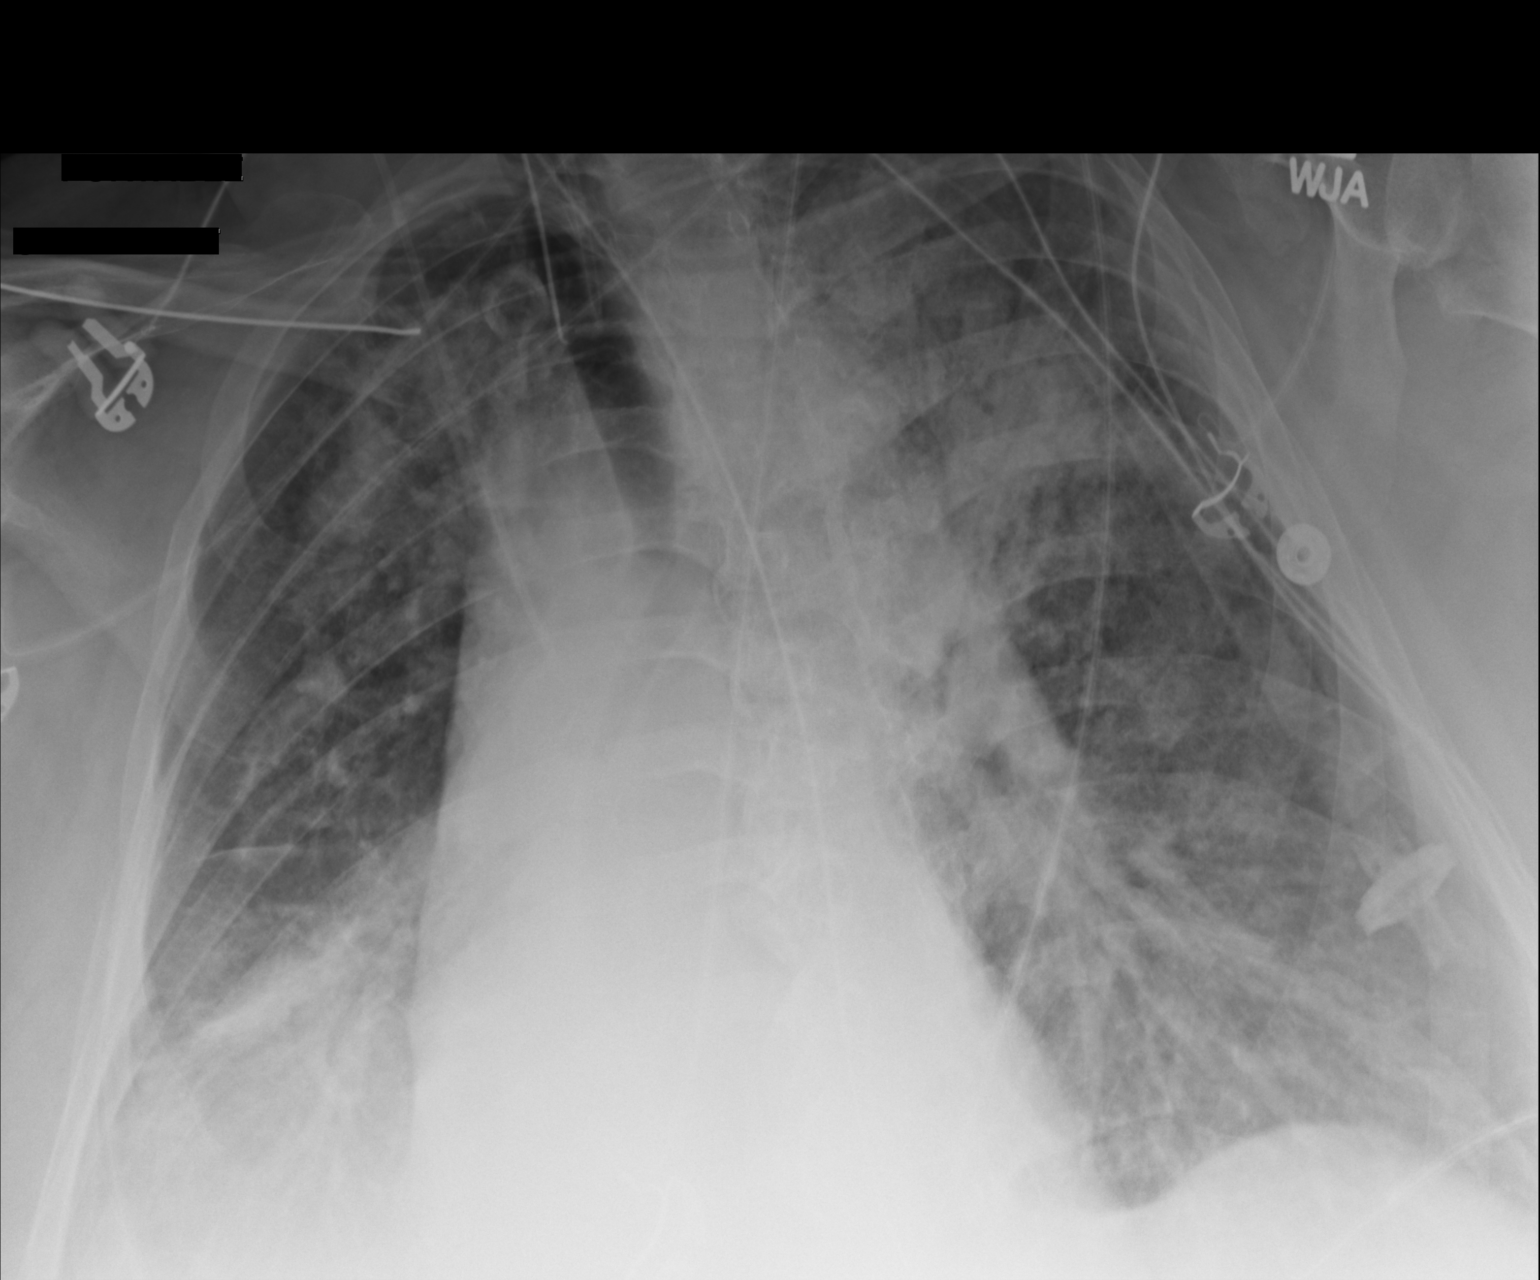

[1 of 1 positions shown; findings below may reference images not displayed]

FINDINGS: The patient is rotated on this study. Since yesterday's study there
has developed further airspace opacity in the left upper lobe. The
interstitial markings of both lungs remain increased and airspace
opacity in the right upper and lower lobes persist. The heart is
top-normal in size. The pulmonary vascularity is mildly prominent
but not clearly engorged. The endotracheal tube tip lies 7.4 cm
above the carina. The esophagogastric tube tip projects below the
inferior margin of the image. The right internal jugular venous
catheter tip projects over the proximal SVC.
IMPRESSION: Allowing for differences in positioning there has been interval
worsening of airspace opacity in the left upper lobe. Persistent
airspace opacities in the right lung are stable. The findings are
consistent with multifocal pneumonia though ARDS may be complicating
the picture. Advancement of the endotracheal tube by 2-3 cm would be
useful.
